# Patient Record
Sex: Male | Born: 1985 | Race: Black or African American | Hispanic: No | Marital: Single | Smoking: Former smoker
Health system: Southern US, Community
[De-identification: ages and names within clinical notes are randomized; demographics above are authoritative.]

## PROBLEM LIST (undated history)

## (undated) DIAGNOSIS — Z789 Other specified health status: Secondary | ICD-10-CM

## (undated) HISTORY — PX: HAND SURGERY: SHX662

---

## 2003-04-14 ENCOUNTER — Emergency Department (HOSPITAL_COMMUNITY): Admission: EM | Admit: 2003-04-14 | Discharge: 2003-04-15 | Payer: Self-pay | Admitting: *Deleted

## 2003-04-24 ENCOUNTER — Emergency Department (HOSPITAL_COMMUNITY): Admission: EM | Admit: 2003-04-24 | Discharge: 2003-04-24 | Payer: Self-pay | Admitting: Emergency Medicine

## 2003-04-30 ENCOUNTER — Ambulatory Visit (HOSPITAL_COMMUNITY): Admission: RE | Admit: 2003-04-30 | Discharge: 2003-04-30 | Payer: Self-pay | Admitting: Orthopedic Surgery

## 2003-05-14 ENCOUNTER — Ambulatory Visit (HOSPITAL_COMMUNITY): Admission: RE | Admit: 2003-05-14 | Discharge: 2003-05-14 | Payer: Self-pay | Admitting: *Deleted

## 2003-05-29 ENCOUNTER — Ambulatory Visit (HOSPITAL_COMMUNITY): Admission: RE | Admit: 2003-05-29 | Discharge: 2003-05-29 | Payer: Self-pay | Admitting: Orthopedic Surgery

## 2003-06-19 ENCOUNTER — Encounter (HOSPITAL_COMMUNITY): Admission: RE | Admit: 2003-06-19 | Discharge: 2003-07-19 | Payer: Self-pay | Admitting: Orthopedic Surgery

## 2005-06-30 ENCOUNTER — Emergency Department (HOSPITAL_COMMUNITY): Admission: EM | Admit: 2005-06-30 | Discharge: 2005-06-30 | Payer: Self-pay | Admitting: Emergency Medicine

## 2005-10-16 ENCOUNTER — Emergency Department (HOSPITAL_COMMUNITY): Admission: EM | Admit: 2005-10-16 | Discharge: 2005-10-16 | Payer: Self-pay | Admitting: Emergency Medicine

## 2005-10-27 ENCOUNTER — Ambulatory Visit: Payer: Self-pay | Admitting: Orthopedic Surgery

## 2011-08-27 ENCOUNTER — Emergency Department (HOSPITAL_COMMUNITY): Payer: Self-pay

## 2011-08-27 ENCOUNTER — Inpatient Hospital Stay (HOSPITAL_COMMUNITY)
Admission: EM | Admit: 2011-08-27 | Discharge: 2011-08-29 | DRG: 641 | Disposition: A | Discharge status: Home / Self Care | Payer: Self-pay | Attending: Internal Medicine | Admitting: Internal Medicine

## 2011-08-27 ENCOUNTER — Encounter (HOSPITAL_COMMUNITY): Payer: Self-pay | Admitting: Emergency Medicine

## 2011-08-27 DIAGNOSIS — T678XXA Other effects of heat and light, initial encounter: Secondary | ICD-10-CM | POA: Diagnosis present

## 2011-08-27 DIAGNOSIS — Z6830 Body mass index (BMI) 30.0-30.9, adult: Secondary | ICD-10-CM

## 2011-08-27 DIAGNOSIS — R739 Hyperglycemia, unspecified: Secondary | ICD-10-CM | POA: Diagnosis present

## 2011-08-27 DIAGNOSIS — D72829 Elevated white blood cell count, unspecified: Secondary | ICD-10-CM | POA: Diagnosis present

## 2011-08-27 DIAGNOSIS — F4489 Other dissociative and conversion disorders: Secondary | ICD-10-CM

## 2011-08-27 DIAGNOSIS — R7989 Other specified abnormal findings of blood chemistry: Secondary | ICD-10-CM

## 2011-08-27 DIAGNOSIS — R4182 Altered mental status, unspecified: Secondary | ICD-10-CM

## 2011-08-27 DIAGNOSIS — R410 Disorientation, unspecified: Secondary | ICD-10-CM | POA: Diagnosis present

## 2011-08-27 DIAGNOSIS — F05 Delirium due to known physiological condition: Secondary | ICD-10-CM | POA: Diagnosis present

## 2011-08-27 DIAGNOSIS — E860 Dehydration: Principal | ICD-10-CM | POA: Diagnosis present

## 2011-08-27 DIAGNOSIS — R7309 Other abnormal glucose: Secondary | ICD-10-CM | POA: Diagnosis present

## 2011-08-27 DIAGNOSIS — E669 Obesity, unspecified: Secondary | ICD-10-CM | POA: Diagnosis present

## 2011-08-27 DIAGNOSIS — Y93H9 Activity, other involving exterior property and land maintenance, building and construction: Secondary | ICD-10-CM

## 2011-08-27 DIAGNOSIS — X30XXXA Exposure to excessive natural heat, initial encounter: Secondary | ICD-10-CM | POA: Diagnosis present

## 2011-08-27 HISTORY — DX: Other specified health status: Z78.9

## 2011-08-27 LAB — CK TOTAL AND CKMB (NOT AT ARMC)
CK, MB: 0.8 ng/mL (ref 0.3–4.0)
Relative Index: 0.3 (ref 0.0–2.5)
Total CK: 241 U/L — ABNORMAL HIGH (ref 7–232)

## 2011-08-27 LAB — MRSA PCR SCREENING: MRSA by PCR: NEGATIVE

## 2011-08-27 LAB — DIFFERENTIAL
Eosinophils Absolute: 0 10*3/uL (ref 0.0–0.7)
Eosinophils Relative: 0 % (ref 0–5)
Lymphocytes Relative: 12 % (ref 12–46)
Lymphs Abs: 1.9 10*3/uL (ref 0.7–4.0)
Monocytes Absolute: 0.7 10*3/uL (ref 0.1–1.0)

## 2011-08-27 LAB — COMPREHENSIVE METABOLIC PANEL
BUN: 12 mg/dL (ref 6–23)
CO2: 18 mEq/L — ABNORMAL LOW (ref 19–32)
Calcium: 11.3 mg/dL — ABNORMAL HIGH (ref 8.4–10.5)
Creatinine, Ser: 1.18 mg/dL (ref 0.50–1.35)
GFR calc Af Amer: >90 mL/min (ref >90)
GFR calc non Af Amer: 83 mL/min — ABNORMAL LOW (ref >90)
Glucose, Bld: 149 mg/dL — ABNORMAL HIGH (ref 70–99)
Total Protein: 8.5 g/dL — ABNORMAL HIGH (ref 6.0–8.3)

## 2011-08-27 LAB — POCT I-STAT, CHEM 8
BUN: 11 mg/dL (ref 6–23)
Calcium, Ion: 1.24 mmol/L (ref 1.12–1.32)
Chloride: 108 mEq/L (ref 96–112)
Creatinine, Ser: 1.3 mg/dL (ref 0.50–1.35)
Glucose, Bld: 140 mg/dL — ABNORMAL HIGH (ref 70–99)

## 2011-08-27 LAB — URINALYSIS, ROUTINE W REFLEX MICROSCOPIC
Glucose, UA: NEGATIVE mg/dL
Ketones, ur: 40 mg/dL — AB

## 2011-08-27 LAB — PROTIME-INR: Prothrombin Time: 13.7 seconds (ref 11.6–15.2)

## 2011-08-27 LAB — URINE MICROSCOPIC-ADD ON

## 2011-08-27 LAB — CBC
HCT: 43.6 % (ref 39.0–52.0)
MCH: 27.5 pg (ref 26.0–34.0)
MCV: 82.1 fL (ref 78.0–100.0)
Platelets: 192 10*3/uL (ref 150–400)
RBC: 5.31 MIL/uL (ref 4.22–5.81)
WBC: 15.5 10*3/uL — ABNORMAL HIGH (ref 4.0–10.5)

## 2011-08-27 LAB — RAPID URINE DRUG SCREEN, HOSP PERFORMED
Barbiturates: NOT DETECTED
Benzodiazepines: NOT DETECTED

## 2011-08-27 LAB — GLUCOSE, CAPILLARY: Glucose-Capillary: 99 mg/dL (ref 70–99)

## 2011-08-27 LAB — TROPONIN I: Troponin I: <0.3 ng/mL (ref <0.30)

## 2011-08-27 MED ORDER — LORAZEPAM 2 MG/ML IJ SOLN
1.0000 mg | Freq: Once | INTRAMUSCULAR | Status: AC
  Administered 2011-08-27: 1 mg via INTRAVENOUS

## 2011-08-27 MED ORDER — SODIUM CHLORIDE 0.9 % IV SOLN
INTRAVENOUS | Status: AC
  Administered 2011-08-27: 21:00:00 via INTRAVENOUS

## 2011-08-27 MED ORDER — SODIUM CHLORIDE 0.9 % IJ SOLN
3.0000 mL | Freq: Two times a day (BID) | INTRAMUSCULAR | Status: DC
  Administered 2011-08-28: 3 mL via INTRAVENOUS

## 2011-08-27 MED ORDER — ZIPRASIDONE MESYLATE 20 MG IM SOLR
INTRAMUSCULAR | Status: AC
  Filled 2011-08-27: qty 20

## 2011-08-27 MED ORDER — SODIUM CHLORIDE 0.9 % IV BOLUS (SEPSIS)
2000.0000 mL | Freq: Once | INTRAVENOUS | Status: AC
  Administered 2011-08-27: 2000 mL via INTRAVENOUS

## 2011-08-27 MED ORDER — THIAMINE HCL 100 MG/ML IJ SOLN
Freq: Once | INTRAVENOUS | Status: AC
  Administered 2011-08-27: 23:00:00 via INTRAVENOUS
  Filled 2011-08-27: qty 1000

## 2011-08-27 MED ORDER — POTASSIUM CHLORIDE IN NACL 20-0.9 MEQ/L-% IV SOLN
INTRAVENOUS | Status: DC
  Administered 2011-08-27 – 2011-08-28 (×3): via INTRAVENOUS

## 2011-08-27 MED ORDER — ENOXAPARIN SODIUM 40 MG/0.4ML ~~LOC~~ SOLN
40.0000 mg | SUBCUTANEOUS | Status: DC
  Administered 2011-08-27 – 2011-08-28 (×2): 40 mg via SUBCUTANEOUS
  Filled 2011-08-27: qty 0.4

## 2011-08-27 MED ORDER — LORAZEPAM 2 MG/ML IJ SOLN
INTRAMUSCULAR | Status: AC
  Filled 2011-08-27: qty 2

## 2011-08-27 MED ORDER — LORAZEPAM 2 MG/ML IJ SOLN: 4.0000 mg | Freq: Once | INTRAMUSCULAR | Status: DC

## 2011-08-27 MED ORDER — ZIPRASIDONE MESYLATE 20 MG IM SOLR
20.0000 mg | Freq: Once | INTRAMUSCULAR | Status: AC
Start: 2011-08-27 — End: 2011-08-27

## 2011-08-27 MED ORDER — LORAZEPAM 2 MG/ML IJ SOLN: 4.0000 mg | Freq: Once | INTRAMUSCULAR | Status: AC

## 2011-08-27 MED ORDER — LORAZEPAM 2 MG/ML IJ SOLN
INTRAMUSCULAR | Status: AC
  Filled 2011-08-27: qty 1

## 2011-08-27 MED ORDER — ACETAMINOPHEN 650 MG RE SUPP
650.0000 mg | RECTAL | Status: DC | PRN
  Administered 2011-08-27: 650 mg via RECTAL
  Filled 2011-08-27: qty 1

## 2011-08-27 MED ORDER — ZIPRASIDONE MESYLATE 20 MG IM SOLR
10.0000 mg | Freq: Once | INTRAMUSCULAR | Status: AC
  Administered 2011-08-27: 10 mg via INTRAMUSCULAR

## 2011-08-27 MED ORDER — M.V.I. ADULT IV INJ
INJECTION | INTRAVENOUS | Status: AC
  Filled 2011-08-27: qty 10

## 2011-08-27 MED ORDER — THIAMINE HCL 100 MG/ML IJ SOLN
INTRAMUSCULAR | Status: AC
  Filled 2011-08-27: qty 2

## 2011-08-27 MED ORDER — DROPERIDOL 2.5 MG/ML IJ SOLN
2.5000 mg | Freq: Once | INTRAMUSCULAR | Status: AC
  Administered 2011-08-27: 2.5 mg via INTRAVENOUS
  Filled 2011-08-27: qty 2

## 2011-08-27 MED ORDER — FOLIC ACID 5 MG/ML IJ SOLN
INTRAMUSCULAR | Status: AC
  Filled 2011-08-27: qty 0.2

## 2011-08-27 MED ORDER — ACETAMINOPHEN 325 MG PO TABS: 650.0000 mg | ORAL_TABLET | ORAL | Status: DC | PRN

## 2011-08-27 MED ORDER — SODIUM CHLORIDE 0.9 % IV BOLUS (SEPSIS)
1000.0000 mL | Freq: Once | INTRAVENOUS | Status: AC
  Administered 2011-08-27: 1000 mL via INTRAVENOUS

## 2011-08-27 NOTE — ED Notes (Signed)
Patient restraints removed. Patient not combative at this time, responding more when asked questions.

## 2011-08-27 NOTE — ED Notes (Signed)
ems called by girlfriend. Girlfriend states patient was out mowing the yard and buckled down like he had been stung by something. No obvious bite marks or swelling noted. Patient yelling no, not responding when asked questions.

## 2011-08-27 NOTE — ED Notes (Signed)
Patient responding well to restraints, no acute distress noted. Patient still combative when you attempt to wake him up or perform any interventions. Attempted to reorient patient.

## 2011-08-27 NOTE — ED Provider Notes (Addendum)
History     CSN: [id number]  Arrival date & time 08/27/11  1858   First MD Initiated Contact with Patient 08/27/11 1900      Chief Complaint  Patient presents with  . Altered Mental Status   Level 5- patient with altered mental status and uncooperative (Consider location/radiation/quality/duration/timing/severity/associated sxs/prior treatment) HPI Per EMS report patient was mowing lawn when he fell to ground and began yelling.  He has continued agitated and is not making appropriate responses. EMS reports they found no evidence of insect sting or bite and patient without reported psychiatric, substance abuse, or medical problems.  BS 163 prehospital and patient vomited twice with a greenish color.  History reviewed. No pertinent past medical history.  History reviewed. No pertinent past surgical history.  History reviewed. No pertinent family history.  History  Substance Use Topics  . Smoking status: Unknown If Ever Smoked  . Smokeless tobacco: Not on file  . Alcohol Use: No      Review of Systems  Unable to perform ROS   Allergies  Review of patient's allergies indicates no known allergies.  Home Medications  No current outpatient prescriptions on file.  BP 121/69  Pulse 93  Resp 24  Ht 5\' 7"  (1.702 m)  Wt 200 lb (90.719 kg)  BMI 31.32 kg/m2  SpO2 100%  Physical Exam  Nursing note and vitals reviewed. Constitutional: He appears well-developed and well-nourished.       Exam limited due to patient's agitation  HENT:  Head: Normocephalic and atraumatic.  Cardiovascular: Normal rate and regular rhythm.   Pulmonary/Chest: Effort normal and breath sounds normal.  Abdominal: Soft.  Musculoskeletal: Normal range of motion.  Neurological:       Patient combative- not oriented to place.  Unable to answer questions- moves all four extremities equally.     ED Course  Procedures (including critical care time) Patient given geodon 20 im, then ativan 1 iv and  droperidol 2.5 iv.  Patient continued combative and received additional ativan 4 mg iv.  Additional physical exam done -  Vital signs remain stable. Head- no signs of trauma Pupils- 2 mm reactive Neck- supple Lungs- cta Skin- no lesions seen.  Labs Reviewed - No data to display No results found.  Results for orders placed during the hospital encounter of 08/27/11  [facility]      Component Value Range   Prothrombin Time 13.7  11.6 - 15.2 seconds   INR 1.03  0.00 - 1.49  APTT      Component Value Range   aPTT 24  24 - 37 seconds  CBC      Component Value Range   WBC 15.5 (*) 4.0 - 10.5 K/uL   RBC 5.31  4.22 - 5.81 MIL/uL   Hemoglobin 14.6  13.0 - 17.0 g/dL   HCT 14.7  82.9 - 56.2 %   MCV 82.1  78.0 - 100.0 fL   MCH 27.5  26.0 - 34.0 pg   MCHC 33.5  30.0 - 36.0 g/dL   RDW 13.0  86.5 - 78.4 %   Platelets 192  150 - 400 K/uL  DIFFERENTIAL      Component Value Range   Neutrophils Relative 83 (*) 43 - 77 %   Neutro Abs 12.9 (*) 1.7 - 7.7 K/uL   Lymphocytes Relative 12  12 - 46 %   Lymphs Abs 1.9  0.7 - 4.0 K/uL   Monocytes Relative 4  3 - 12 %   Monocytes Absolute  0.7  0.1 - 1.0 K/uL   Eosinophils Relative 0  0 - 5 %   Eosinophils Absolute 0.0  0.0 - 0.7 K/uL   Basophils Relative 0  0 - 1 %   Basophils Absolute 0.0  0.0 - 0.1 K/uL  COMPREHENSIVE METABOLIC PANEL      Component Value Range   Sodium 139  135 - 145 mEq/L   Potassium 3.6  3.5 - 5.1 mEq/L   Chloride 101  96 - 112 mEq/L   CO2 18 (*) 19 - 32 mEq/L   Glucose, Bld 149 (*) 70 - 99 mg/dL   BUN 12  6 - 23 mg/dL   Creatinine, Ser 1.61  0.50 - 1.35 mg/dL   Calcium 09.6 (*) 8.4 - 10.5 mg/dL   Total Protein 8.5 (*) 6.0 - 8.3 g/dL   Albumin 4.9  3.5 - 5.2 g/dL   AST 21  0 - 37 U/L   ALT 27  0 - 53 U/L   Alkaline Phosphatase 131 (*) 39 - 117 U/L   Total Bilirubin 0.8  0.3 - 1.2 mg/dL   GFR calc non Af Amer 83 (*) >90 mL/min   GFR calc Af Amer >90  >90 mL/min  CK TOTAL AND CKMB      Component Value Range   Total  CK 241 (*) 7 - 232 U/L   CK, MB 0.8  0.3 - 4.0 ng/mL   Relative Index 0.3  0.0 - 2.5  TROPONIN I      Component Value Range   Troponin I <0.30  <0.30 ng/mL  URINE RAPID DRUG SCREEN (HOSP PERFORMED)      Component Value Range   Opiates NONE DETECTED  NONE DETECTED   Cocaine NONE DETECTED  NONE DETECTED   Benzodiazepines NONE DETECTED  NONE DETECTED   Amphetamines NONE DETECTED  NONE DETECTED   Tetrahydrocannabinol POSITIVE (*) NONE DETECTED   Barbiturates NONE DETECTED  NONE DETECTED  ETHANOL      Component Value Range   Alcohol, Ethyl (B) <11  0 - 11 mg/dL  POCT I-STAT, CHEM 8      Component Value Range   Sodium 144  135 - 145 mEq/L   Potassium 3.6  3.5 - 5.1 mEq/L   Chloride 108  96 - 112 mEq/L   BUN 11  6 - 23 mg/dL   Creatinine, Ser 0.45  0.50 - 1.35 mg/dL   Glucose, Bld 409 (*) 70 - 99 mg/dL   Calcium, Ion 8.11  9.14 - 1.32 mmol/L   TCO2 18  0 - 100 mmol/L   Hemoglobin 15.6  13.0 - 17.0 g/dL   HCT 78.2  95.6 - 21.3 %  GLUCOSE, CAPILLARY      Component Value Range   Glucose-Capillary 99  70 - 99 mg/dL   Date: 08/65/7846  Rate: 96  Rhythm: normal sinus rhythm  QRS Axis: normal  Intervals: normal  ST/T Wave abnormalities: normal  Conduction Disutrbances: none  Narrative Interpretation: unremarkable      No diagnosis found.  8:03 PM Patient's gf arrived and states they were mowing grass when he thought a bee stung him.  He went to sit down then began complaining of a headache.  Thirty minutes later they were driving home and he continued to complain of severe headache then began getting more confused.  She called ems.  She states he does not use drugs or drink alcohol and has no known medical problems.  CRITICAL CARE Performed by: Margarita Grizzle  S   Total critical care time: 60  Critical care time was exclusive of separately billable procedures and treating other patients.  Critical care was necessary to treat or prevent imminent or life-threatening  deterioration.  Critical care was time spent personally by me on the following activities: development of treatment plan with patient and/or surrogate as well as nursing, discussions with consultants, evaluation of patient's response to treatment, examination of patient, obtaining history from patient or surrogate, ordering and performing treatments and interventions, ordering and review of laboratory studies, ordering and review of radiographic studies, pulse oximetry and re-evaluation of patient's condition.  Date: 10/02/2011  Rate: 99  Rhythm: normal sinus rhythm  QRS Axis: normal  Intervals: normal  ST/T Wave abnormalities: normal  Conduction Disutrbances: none  Narrative Interpretation: unremarkable     MDM  Patient with confusion of unclear etiology.  Head CT without acute abnormality. His urine is positive for THC.  Patient's care discussed with Dr. Orvan Falconer. He will be admitted to a step down bed.      Hilario Quarry, MD 08/27/11 1610  Hilario Quarry, MD 09/28/11 9604  Hilario Quarry, MD 10/02/11 5409

## 2011-08-27 NOTE — ED Notes (Addendum)
Patient placed in orange and black gurney four point restraints as directed by Dr. Rosalia Hammers. Patient combative, swinging, and cursing at staff. Patient attempted to grab and hit several staff members, patient confused and unaware of surroundings or situation. Staff attempted to reorient patient prior to restraining patient. Medication not effective. Dr. Rosalia Hammers gave verbal order to place patient in restraints because it was essential for [place] to obtain a stat ct and also to get a urine specimen.

## 2011-08-27 NOTE — H&P (Addendum)
PCP:   No primary provider on file.   Chief Complaint:  Acute confusion since this afternoon   History is taken from patient's girlfriend, due to patient's acute confusional state.  HPI: Walter Blackwell is an 26 y.o. male.   Patient was mowing the lawn between 12 noon and 3 PM today. At one point patient screamed out he had been bitten on his left leg, he felt it may have been by a bee. Patient's leg was inspected and reportedly showed a swollen area compatible with a bite and he subsequently began so act strangely. Although he continued mowing the lawn he complained of a headache, and began to vomit,  and his girlfriend eventually persuaded him to sit down and rest, but he soon began to appear very confused, seemed to be clearly recognizing his girlfriend or his baby, complaining of severe headache calling out and in general acting strange. As he showed no signs of recovery girlfriend eventually called ambulance and he was brought to the emergency room at around 7 PM.   In emergency room patient was confused and combative, required 4-point restraints and several doses of Ativan and Geodon for sedation. Initial examination in the emergency room did not reveal any evidence of bites or swelling of the legs.  His girlfriend says he has no history of alcohol or illicit drug use, and because she was with him today as she doubts that there was an opportunity for acute stimulant drug ingestion.  He has no history of chronic medical problems and does not take any over-the-counter medication.  Rewiew of Systems:  The patient denies anorexia,  weight loss,, vision loss, decreased hearing, hoarseness, chest pain, syncope, dyspnea on exertion, peripheral edema, balance deficits, hemoptysis, abdominal pain, melena, hematochezia, severe indigestion/heartburn, hematuria, incontinence, genital sores, muscle weakness, suspicious skin lesions, transient blindness, difficulty walking, depression, unusual weight  change, abnormal bleeding, enlarged lymph nodes, angioedema, and breast masses.   Past Medical History  Diagnosis Date  . No pertinent past medical history     History reviewed. No pertinent past surgical history.  Medications:  HOME MEDS: Prior to Admission medications   Not on File     Allergies:  No Known Allergies  Social History:   has an unknown smoking status. He does not have any smokeless tobacco history on file. He reports that he does not drink alcohol. His drug history not on file.  Family History: History reviewed. No pertinent family history.   Physical Exam: Filed Vitals:   08/27/11 2015 08/27/11 2112 08/27/11 2153 08/27/11 2156  BP:  138/81  136/77  Pulse: 104 110  109  Temp:   101.4 F (38.6 C) 101.4 F (38.6 C)  TempSrc:   Oral Oral  Resp: 21 24  24   Height:   6\' 1"  (1.854 m) 6\' 1"  (1.854 m)  Weight:   104.3 kg (229 lb 15 oz) 104.3 kg (229 lb 15 oz)  SpO2: 100% 97%  100%   Blood pressure 136/77, pulse 109, temperature 101.4 F (38.6 C), temperature source Oral, resp. rate 24, height 6\' 1"  (1.854 m), weight 104.3 kg (229 lb 15 oz), SpO2 100.00%.  GEN:  obese but muscular young African American gentleman lying in the stretcher, sedated but rousable,  PSYCH:   unable to assess because of sedation  HEENT: Mucous membranes pink, dry  and anicteric; PERRLA; EOM intact; no cervical lymphadenopathy nor thyromegaly or carotid bruit;  Breasts:: Not examined CHEST WALL: No tenderness CHEST: Normal respiration, clear to  auscultation bilaterally HEART: Regular rhythm; tachycardia; no murmurs rubs or gallops BACK: No kyphosis or scoliosis; no CVA tenderness ABDOMEN: Obese, soft non-tender; no masses, no organomegaly, normal abdominal bowel sounds; no pannus; no intertriginous candida. Rectal Exam: Not done EXTREMITIES: No bone or joint deformity; no edema; no ulcerations. no swelling; no bruises  Genitalia: not examined PULSES: 2+ and symmetric SKIN:  Normal hydration no rash or ulceration CNS: Cranial nerves 2-12 grossly intact no focal lateralizing neurologic deficit   Labs & Imaging Results for orders placed during the hospital encounter of 08/27/11 (from the past 48 hour(s))  PROTIME-INR     Status: Normal   Collection Time   08/27/11  7:17 PM      Component Value Range Comment   Prothrombin Time 13.7  11.6 - 15.2 seconds    INR 1.03  0.00 - 1.49   APTT     Status: Normal   Collection Time   08/27/11  7:17 PM      Component Value Range Comment   aPTT 24  24 - 37 seconds   CBC     Status: Abnormal   Collection Time   08/27/11  7:17 PM      Component Value Range Comment   WBC 15.5 (*) 4.0 - 10.5 K/uL    RBC 5.31  4.22 - 5.81 MIL/uL    Hemoglobin 14.6  13.0 - 17.0 g/dL    HCT 16.1  09.6 - 04.5 %    MCV 82.1  78.0 - 100.0 fL    MCH 27.5  26.0 - 34.0 pg    MCHC 33.5  30.0 - 36.0 g/dL    RDW 40.9  81.1 - 91.4 %    Platelets 192  150 - 400 K/uL   DIFFERENTIAL     Status: Abnormal   Collection Time   08/27/11  7:17 PM      Component Value Range Comment   Neutrophils Relative 83 (*) 43 - 77 %    Neutro Abs 12.9 (*) 1.7 - 7.7 K/uL    Lymphocytes Relative 12  12 - 46 %    Lymphs Abs 1.9  0.7 - 4.0 K/uL    Monocytes Relative 4  3 - 12 %    Monocytes Absolute 0.7  0.1 - 1.0 K/uL    Eosinophils Relative 0  0 - 5 %    Eosinophils Absolute 0.0  0.0 - 0.7 K/uL    Basophils Relative 0  0 - 1 %    Basophils Absolute 0.0  0.0 - 0.1 K/uL   COMPREHENSIVE METABOLIC PANEL     Status: Abnormal   Collection Time   08/27/11  7:17 PM      Component Value Range Comment   Sodium 139  135 - 145 mEq/L    Potassium 3.6  3.5 - 5.1 mEq/L    Chloride 101  96 - 112 mEq/L    CO2 18 (*) 19 - 32 mEq/L    Glucose, Bld 149 (*) 70 - 99 mg/dL    BUN 12  6 - 23 mg/dL    Creatinine, Ser 7.82  0.50 - 1.35 mg/dL    Calcium 95.6 (*) 8.4 - 10.5 mg/dL    Total Protein 8.5 (*) 6.0 - 8.3 g/dL    Albumin 4.9  3.5 - 5.2 g/dL    AST 21  0 - 37 U/L    ALT 27  0 -  53 U/L    Alkaline Phosphatase 131 (*) 39 -  117 U/L    Total Bilirubin 0.8  0.3 - 1.2 mg/dL    GFR calc non Af Amer 83 (*) >90 mL/min    GFR calc Af Amer >90  >90 mL/min   CK TOTAL AND CKMB     Status: Abnormal   Collection Time   08/27/11  7:17 PM      Component Value Range Comment   Total CK 241 (*) 7 - 232 U/L    CK, MB 0.8  0.3 - 4.0 ng/mL    Relative Index 0.3  0.0 - 2.5   TROPONIN I     Status: Normal   Collection Time   08/27/11  7:17 PM      Component Value Range Comment   Troponin I <0.30  <0.30 ng/mL   ETHANOL     Status: Normal   Collection Time   08/27/11  7:17 PM      Component Value Range Comment   Alcohol, Ethyl (B) <11  0 - 11 mg/dL   URINE RAPID DRUG SCREEN (HOSP PERFORMED)     Status: Abnormal   Collection Time   08/27/11  7:56 PM      Component Value Range Comment   Opiates NONE DETECTED  NONE DETECTED    Cocaine NONE DETECTED  NONE DETECTED    Benzodiazepines NONE DETECTED  NONE DETECTED    Amphetamines NONE DETECTED  NONE DETECTED    Tetrahydrocannabinol POSITIVE (*) NONE DETECTED    Barbiturates NONE DETECTED  NONE DETECTED   URINALYSIS, ROUTINE W REFLEX MICROSCOPIC     Status: Abnormal   Collection Time   08/27/11  7:56 PM      Component Value Range Comment   Color, Urine YELLOW  YELLOW    APPearance CLEAR  CLEAR    Specific Gravity, Urine 1.025  1.005 - 1.030    pH 6.0  5.0 - 8.0    Glucose, UA NEGATIVE  NEGATIVE mg/dL    Hgb urine dipstick SMALL (*) NEGATIVE    Bilirubin Urine NEGATIVE  NEGATIVE    Ketones, ur 40 (*) NEGATIVE mg/dL    Protein, ur TRACE (*) NEGATIVE mg/dL    Urobilinogen, UA 0.2  0.0 - 1.0 mg/dL    Nitrite NEGATIVE  NEGATIVE    Leukocytes, UA NEGATIVE  NEGATIVE   URINE MICROSCOPIC-ADD ON     Status: Normal   Collection Time   08/27/11  7:56 PM      Component Value Range Comment   Squamous Epithelial / LPF RARE  RARE    WBC, UA 0-2  <3 WBC/hpf    RBC / HPF 3-6  <3 RBC/hpf    Bacteria, UA RARE  RARE    Urine-Other MUCOUS PRESENT      POCT I-STAT, CHEM 8     Status: Abnormal   Collection Time   08/27/11  7:59 PM      Component Value Range Comment   Sodium 144  135 - 145 mEq/L    Potassium 3.6  3.5 - 5.1 mEq/L    Chloride 108  96 - 112 mEq/L    BUN 11  6 - 23 mg/dL    Creatinine, Ser 1.61  0.50 - 1.35 mg/dL    Glucose, Bld 096 (*) 70 - 99 mg/dL    Calcium, Ion 0.45  4.09 - 1.32 mmol/L    TCO2 18  0 - 100 mmol/L    Hemoglobin 15.6  13.0 - 17.0 g/dL    HCT 81.1  91.4 -  52.0 %   GLUCOSE, CAPILLARY     Status: Normal   Collection Time   08/27/11  8:02 PM      Component Value Range Comment   Glucose-Capillary 99  70 - 99 mg/dL    Dg Chest 1 View  7/82/9562  *RADIOLOGY REPORT*  Clinical Data: Acute psychosis  CHEST - 1 VIEW  Comparison: Portable exam 2045 hours without priors for comparison  Findings: Low inspiratory volumes. Normal heart size, mediastinal contours and pulmonary vascularity for technique. Lungs grossly clear. No pleural effusion or pneumothorax.  IMPRESSION: Low lung volumes. Otherwise negative exam.  Original Report Authenticated By: Lollie Marrow, M.D.   Ct Head Wo Contrast  08/27/2011  *RADIOLOGY REPORT*  Clinical Data: Altered mental status.  CT HEAD WITHOUT CONTRAST  Technique:  Contiguous axial images were obtained from the base of the skull through the vertex without contrast.  Comparison: No priors.  Findings: No acute intracranial abnormalities.  Specifically, no signs of acute/subacute cerebral ischemia, no evidence of acute intracranial hemorrhage, no focal mass, mass effect, hydrocephalus or abnormal intra or extra-axial fluid collections.  No acute displaced skull fractures are identified.  Visualized paranasal sinuses and mastoids are well pneumatized.  IMPRESSION: 1.  No acute intracranial abnormalities.  The appearance of brain is normal.  Original Report Authenticated By: Florencia Reasons, M.D.      Assessment Present on Admission:  .Acute or subacute confusional psychotic  state .Acute delirium .Hypercalcemia, probably due to dehydration  .Leukocytosis, only due to acute dehydration and reactive state  .Hyperglycemia .Obesity   Discussion/PLAN: Headache nausea vomiting tachycardia fever and acute confusional state are compatible with both acute stimulant ingestion, or other poisoning including insect bite. Possibly early CNS infection. Possibly acute psychotic reaction related to recent marijuana use.  We'll hydrate this gentleman vigorously, recheck labs including CPK and cardiac enzymes in the morning. Will review him in a few hours, blood cultures and depending on his mental status start empiric doxycycline.   [place] demographic will also check acute HIV status.  Depending on his progress, consider lumbar puncture.  Other plans as per orders.  Sendy Pluta 08/27/2011, 9:59 PM

## 2011-08-27 NOTE — ED Notes (Signed)
Attempted to reorient patient. No acute distress noted at this time. Patient still unable to follow commands.

## 2011-08-28 DIAGNOSIS — F4489 Other dissociative and conversion disorders: Secondary | ICD-10-CM

## 2011-08-28 DIAGNOSIS — F05 Delirium due to known physiological condition: Secondary | ICD-10-CM

## 2011-08-28 DIAGNOSIS — R7989 Other specified abnormal findings of blood chemistry: Secondary | ICD-10-CM

## 2011-08-28 LAB — COMPREHENSIVE METABOLIC PANEL
ALT: 18 U/L (ref 0–53)
AST: 18 U/L (ref 0–37)
Albumin: 3.4 g/dL — ABNORMAL LOW (ref 3.5–5.2)
Alkaline Phosphatase: 99 U/L (ref 39–117)
Chloride: 110 mEq/L (ref 96–112)
Potassium: 3.6 mEq/L (ref 3.5–5.1)
Sodium: 140 mEq/L (ref 135–145)
Total Bilirubin: 0.7 mg/dL (ref 0.3–1.2)
Total Protein: 6.2 g/dL (ref 6.0–8.3)

## 2011-08-28 LAB — TSH: TSH: 0.957 u[IU]/mL (ref 0.350–4.500)

## 2011-08-28 LAB — CBC
HCT: 35.2 % — ABNORMAL LOW (ref 39.0–52.0)
Platelets: 175 10*3/uL (ref 150–400)
RDW: 13.8 % (ref 11.5–15.5)
WBC: 11.4 10*3/uL — ABNORMAL HIGH (ref 4.0–10.5)

## 2011-08-28 LAB — HEMOGLOBIN A1C
Hgb A1c MFr Bld: 5.6 % (ref <5.7)
Mean Plasma Glucose: 114 mg/dL (ref <117)

## 2011-08-28 LAB — CARDIAC PANEL(CRET KIN+CKTOT+MB+TROPI): Relative Index: 0.2 (ref 0.0–2.5)

## 2011-08-28 MED ORDER — DOXYCYCLINE HYCLATE 100 MG IV SOLR
100.0000 mg | Freq: Two times a day (BID) | INTRAVENOUS | Status: DC
  Administered 2011-08-28 – 2011-08-29 (×3): 100 mg via INTRAVENOUS
  Filled 2011-08-28 (×5): qty 100

## 2011-08-28 MED ORDER — DOXYCYCLINE HYCLATE 100 MG IV SOLR
INTRAVENOUS | Status: AC
  Filled 2011-08-28: qty 100

## 2011-08-28 NOTE — Progress Notes (Signed)
Subjective: Patient appears to be clinically improving.  He is awake, alert, able to carry on a conversation.  Has continued headache.  No neck pain or vomiting.  No other complaints.  He reports that he was moving his lawn in the sun for approximately 3 hours prior to the onset of his symptoms.  There is questionable history of insect bite  Objective: Vital signs in last 24 hours: Temp:  [99.2 F (37.3 C)-101.4 F (38.6 C)] 99.3 F (37.4 C) (06/29 0742) Pulse Rate:  [91-119] 96  (06/29 0600) Resp:  [16-27] 20  (06/29 0600) BP: (91-142)/(43-85) 114/68 mmHg (06/29 0600) SpO2:  [97 %-100 %] 99 % (06/29 0600) Weight:  [90.719 kg (200 lb)-104.3 kg (229 lb 15 oz)] 104.3 kg (229 lb 15 oz) (06/28 2156) Weight change:  Last BM Date:  (unknown)  Intake/Output from previous day: 06/28 0701 - 06/29 0700 In: 1560 [I.V.:1310; IV Piggyback:250] Out: 1250 [Urine:1250]     Physical Exam: General: Alert, awake, oriented x3, in no acute distress. HEENT: No bruits, no goiter. No meningismus Heart: Regular rate and rhythm, without murmurs, rubs, gallops. Lungs: Clear to auscultation bilaterally. Abdomen: Soft, nontender, nondistended, positive bowel sounds. Extremities: No clubbing cyanosis or edema with positive pedal pulses. Neuro: Grossly intact, nonfocal.    Lab Results: Basic Metabolic Panel:  Basename 08/28/11 0500 08/27/11 1959 08/27/11 1917  NA 140 144 --  K 3.6 3.6 --  CL 110 108 --  CO2 21 -- 18*  GLUCOSE 96 140* --  BUN 8 11 --  CREATININE 0.96 1.30 --  CALCIUM 9.0 -- 11.3*  MG -- -- --  PHOS -- -- --   Liver Function Tests:  Basename 08/28/11 0500 08/27/11 1917  AST 18 21  ALT 18 27  ALKPHOS 99 131*  BILITOT 0.7 0.8  PROT 6.2 8.5*  ALBUMIN 3.4* 4.9   No results found for this basename: LIPASE:2,AMYLASE:2 in the last 72 hours No results found for this basename: AMMONIA:2 in the last 72 hours CBC:  Basename 08/28/11 0500 08/27/11 1959 08/27/11 1917  WBC 11.4* --  15.5*  NEUTROABS -- -- 12.9*  HGB 11.7* 15.6 --  HCT 35.2* 46.0 --  MCV 82.8 -- 82.1  PLT 175 -- 192   Cardiac Enzymes:  Basename 08/28/11 0500 08/27/11 1917  CKTOTAL 442* 241*  CKMB 1.0 0.8  CKMBINDEX -- --  TROPONINI <0.30 <0.30   BNP: No results found for this basename: PROBNP:3 in the last 72 hours D-Dimer: No results found for this basename: DDIMER:2 in the last 72 hours CBG:  Basename 08/27/11 2002  GLUCAP 99   Hemoglobin A1C: No results found for this basename: HGBA1C in the last 72 hours Fasting Lipid Panel: No results found for this basename: CHOL,HDL,LDLCALC,TRIG,CHOLHDL,LDLDIRECT in the last 72 hours Thyroid Function Tests: No results found for this basename: TSH,T4TOTAL,FREET4,T3FREE,THYROIDAB in the last 72 hours Anemia Panel: No results found for this basename: VITAMINB12,FOLATE,FERRITIN,TIBC,IRON,RETICCTPCT in the last 72 hours Coagulation:  Basename 08/27/11 1917  LABPROT 13.7  INR 1.03   Urine Drug Screen: Drugs of Abuse     Component Value Date/Time   LABOPIA NONE DETECTED 08/27/2011 1956   COCAINSCRNUR NONE DETECTED 08/27/2011 1956   LABBENZ NONE DETECTED 08/27/2011 1956   AMPHETMU NONE DETECTED 08/27/2011 1956   THCU POSITIVE* 08/27/2011 1956   LABBARB NONE DETECTED 08/27/2011 1956    Alcohol Level:  Basename 08/27/11 1917  ETH <11   Urinalysis:  Basename 08/27/11 1956  COLORURINE YELLOW  LABSPEC 1.025  PHURINE 6.0  GLUCOSEU NEGATIVE  HGBUR SMALL*  BILIRUBINUR NEGATIVE  KETONESUR 40*  PROTEINUR TRACE*  UROBILINOGEN 0.2  NITRITE NEGATIVE  LEUKOCYTESUR NEGATIVE    Recent Results (from the past 240 hour(s))  MRSA PCR SCREENING     Status: Normal   Collection Time   08/27/11  9:41 PM      Component Value Range Status Comment   MRSA by PCR NEGATIVE  NEGATIVE Final   CULTURE, BLOOD (ROUTINE X 2)     Status: Normal (Preliminary result)   Collection Time   08/28/11  5:01 AM      Component Value Range Status Comment   Specimen  Description BLOOD LEFT ARM   Final    Special Requests     Final    Value: BOTTLES DRAWN AEROBIC AND ANAEROBIC 8CC EACH BOTTLE   Culture PENDING   Incomplete    Report Status PENDING   Incomplete   CULTURE, BLOOD (ROUTINE X 2)     Status: Normal (Preliminary result)   Collection Time   08/28/11  5:01 AM      Component Value Range Status Comment   Specimen Description BLOOD LEFT HAND   Final    Special Requests BOTTLES DRAWN AEROBIC ONLY 8CC BOTTLE   Final    Culture PENDING   Incomplete    Report Status PENDING   Incomplete     Studies/Results: Dg Chest 1 View  08/27/2011  *RADIOLOGY REPORT*  Clinical Data: Acute psychosis  CHEST - 1 VIEW  Comparison: Portable exam 2045 hours without priors for comparison  Findings: Low inspiratory volumes. Normal heart size, mediastinal contours and pulmonary vascularity for technique. Lungs grossly clear. No pleural effusion or pneumothorax.  IMPRESSION: Low lung volumes. Otherwise negative exam.  Original Report Authenticated By: Lollie Marrow, M.D.   Ct Head Wo Contrast  08/27/2011  *RADIOLOGY REPORT*  Clinical Data: Altered mental status.  CT HEAD WITHOUT CONTRAST  Technique:  Contiguous axial images were obtained from the base of the skull through the vertex without contrast.  Comparison: No priors.  Findings: No acute intracranial abnormalities.  Specifically, no signs of acute/subacute cerebral ischemia, no evidence of acute intracranial hemorrhage, no focal mass, mass effect, hydrocephalus or abnormal intra or extra-axial fluid collections.  No acute displaced skull fractures are identified.  Visualized paranasal sinuses and mastoids are well pneumatized.  IMPRESSION: 1.  No acute intracranial abnormalities.  The appearance of brain is normal.  Original Report Authenticated By: Florencia Reasons, M.D.    Medications: Scheduled Meds:   . sodium chloride   Intravenous STAT  . doxycycline (VIBRAMYCIN) IV  100 mg Intravenous Q12H  . droperidol  2.5  mg Intravenous Once  . enoxaparin  40 mg Subcutaneous Q24H  . LORazepam      . LORazepam      . LORazepam  1 mg Intravenous Once  . LORazepam  4 mg Intravenous Once  . LORazepam  4 mg Intravenous Once  . general admission iv infusion   Intravenous Once  . sodium chloride  1,000 mL Intravenous Once  . sodium chloride  2,000 mL Intravenous Once  . sodium chloride  3 mL Intravenous Q12H  . ziprasidone      . ziprasidone  10 mg Intramuscular Once  . ziprasidone  20 mg Intramuscular Once   Continuous Infusions:   . 0.9 % NaCl with KCl 20 mEq / L 150 mL/hr at 08/28/11 0600   PRN Meds:.acetaminophen, acetaminophen  Assessment/Plan:  Active Problems:  Acute or subacute confusional psychotic state  Acute delirium  Hypercalcemia  Leukocytosis  Hyperglycemia  Obesity  Plan:  1. Acute delirium, possibly related to exertional heat illness. Although infectious etiology is a possibility, a CNS infection at this point is less likely.  He was started on IV doxycycline last night.  Cultures have been sent and will be followed up.  His mental status appears to be back to baseline.  2. Dehydration, improving with IVF  3. Leukocytosis, improved  4. Fever, possibly related to heat illness vs. Infection. Continue to monitor for recurrence.  5. Hypercalcemia, related to dehydration, improved.  5. Dispo.  Transfer to med surg bed, will continue to monitor for recurrence of symptoms.   LOS: 1 day   Jemmie Rhinehart [facility] Pager: [phone] 08/28/2011, 8:49 AM

## 2011-08-29 ENCOUNTER — Encounter (HOSPITAL_COMMUNITY): Payer: Self-pay | Admitting: *Deleted

## 2011-08-29 DIAGNOSIS — E860 Dehydration: Secondary | ICD-10-CM

## 2011-08-29 DIAGNOSIS — T678XXA Other effects of heat and light, initial encounter: Secondary | ICD-10-CM

## 2011-08-29 DIAGNOSIS — F05 Delirium due to known physiological condition: Secondary | ICD-10-CM

## 2011-08-29 LAB — CBC
HCT: 39.7 % (ref 39.0–52.0)
Hemoglobin: 13.3 g/dL (ref 13.0–17.0)
MCH: 27.8 pg (ref 26.0–34.0)
MCHC: 33.5 g/dL (ref 30.0–36.0)
MCV: 82.9 fL (ref 78.0–100.0)
RBC: 4.79 MIL/uL (ref 4.22–5.81)

## 2011-08-29 LAB — BASIC METABOLIC PANEL
BUN: 6 mg/dL (ref 6–23)
CO2: 20 mEq/L (ref 19–32)
Calcium: 9.9 mg/dL (ref 8.4–10.5)
Creatinine, Ser: 0.89 mg/dL (ref 0.50–1.35)
GFR calc non Af Amer: >90 mL/min (ref >90)
Glucose, Bld: 99 mg/dL (ref 70–99)

## 2011-08-29 MED ORDER — DOXYCYCLINE HYCLATE 100 MG PO TABS
100.0000 mg | ORAL_TABLET | Freq: Two times a day (BID) | ORAL | Status: DC
  Administered 2011-08-29: 100 mg via ORAL
  Filled 2011-08-29: qty 1

## 2011-08-29 MED ORDER — SODIUM CHLORIDE 0.9 % IJ SOLN
INTRAMUSCULAR | Status: AC
  Filled 2011-08-29: qty 3

## 2011-08-29 NOTE — Discharge Summary (Signed)
Physician Discharge Summary  Patient ID: HARRIET BOLLEN MRN: [id number] DOB/AGE: 1986/01/30 26 y.o.  Admit date: 08/27/2011 Discharge date: 08/29/2011  Primary Care Physician:  No primary provider on file.   Discharge Diagnoses:   Principal Problem:  Exertional Heat Illness Active Problems:  Acute or subacute confusional psychotic state  Acute delirium  Hypercalcemia  Leukocytosis  Hyperglycemia  Obesity  Dehydration   Medication List    Notice       You have not been prescribed any medications.            Discharge Exam: Blood pressure 125/74, pulse 69, temperature 97.5 F (36.4 C), temperature source Oral, resp. rate 17, height 6\' 1"  (1.854 m), weight 103.2 kg (227 lb 8.2 oz), SpO2 98.00%. NAD CTA B S1, S2, RRR Soft, NT, BS+ No edema b/l  Disposition and Follow-up:  Follow up with a primary doctor as needed  Consults: none   Significant Diagnostic Studies:  Dg Chest 1 View  08/27/2011  *RADIOLOGY REPORT*  Clinical Data: Acute psychosis  CHEST - 1 VIEW  Comparison: Portable exam 2045 hours without priors for comparison  Findings: Low inspiratory volumes. Normal heart size, mediastinal contours and pulmonary vascularity for technique. Lungs grossly clear. No pleural effusion or pneumothorax.  IMPRESSION: Low lung volumes. Otherwise negative exam.  Original Report Authenticated By: Lollie Marrow, M.D.   Ct Head Wo Contrast  08/27/2011  *RADIOLOGY REPORT*  Clinical Data: Altered mental status.  CT HEAD WITHOUT CONTRAST  Technique:  Contiguous axial images were obtained from the base of the skull through the vertex without contrast.  Comparison: No priors.  Findings: No acute intracranial abnormalities.  Specifically, no signs of acute/subacute cerebral ischemia, no evidence of acute intracranial hemorrhage, no focal mass, mass effect, hydrocephalus or abnormal intra or extra-axial fluid collections.  No acute displaced skull fractures are identified.  Visualized  paranasal sinuses and mastoids are well pneumatized.  IMPRESSION: 1.  No acute intracranial abnormalities.  The appearance of brain is normal.  Original Report Authenticated By: Florencia Reasons, M.D.    Brief H and P: For complete details please refer to admission H and P, but in brief Walter Blackwell is an 26 y.o. male. Patient was mowing the lawn between 12 noon and 3 PM today. At one point patient screamed out he had been bitten on his left leg, he felt it may have been by a bee. Patient's leg was inspected and reportedly showed a swollen area compatible with a bite and he subsequently began so act strangely. Although he continued mowing the lawn he complained of a headache, and began to vomit, and his girlfriend eventually persuaded him to sit down and rest, but he soon began to appear very confused, seemed to be clearly recognizing his girlfriend or his baby, complaining of severe headache calling out and in general acting strange. As he showed no signs of recovery girlfriend eventually called ambulance and he was brought to the emergency room at around 7 PM.  In emergency room patient was confused and combative, required 4-point restraints and several doses of Ativan and Geodon for sedation. Initial examination in the emergency room did not reveal any evidence of bites or swelling of the legs.  His girlfriend says he has no history of alcohol or illicit drug use, and because she was with him today as she doubts that there was an opportunity for acute stimulant drug ingestion.  He has no history of chronic medical problems and does not  take any over-the-counter medication.   Hospital Course:   This time was admitted to the hospital with acute delirium. Prior to arrival to the emergency room, patient was mowing his lawn in the sun for approximately 3 hours. He began complaining of a headache, began to vomit, and then became very confused and was not recognizing his family. His behavior became  very strange. He was brought to the emergency room for evaluation where he was noted to be febrile. He was significantly dehydrated. He required Ativan and Geodon for sedation. He was admitted to the step down for further evaluation. There was questionable history of a insect bite, although on arrival to the hospital he did not have any evidence of this on his legs. Patient had infectious workup done which was found to be unrevealing. Chest x-ray did not show any signs of pneumonia, urinalysis did not show any signs of infection and blood cultures have shown no growth. He did not have any meningismus. He was given IV fluids and due to history of possible insect bite he was started on doxycycline. Within 12 hours of admission patient's fever had resolved and his mental status had returned to baseline. He was continued on IV fluids. His dehydration has since resolved. Today he is feeling back to normal. He does not have any headache. He is requesting a discharge home. It is unlikely that his acute presentation was secondary to an infectious etiology. It is more likely that he has suffered a form of exertional heat illness. He was advised to keep herself hydrated especially when working out in the sun.  Time spent on Discharge:  Signed: Zyrah Wiswell [facility] Pager: [phone] 08/29/2011, 12:43 PM

## 2011-08-29 NOTE — Discharge Instructions (Signed)
Heat Illness  Heat exhaustion happens when the body loses too much water through sweating. This can lead to heat stroke. Heat stroke is a medical emergency. People who work in hot environments, athletes, and older people are at greater risk for suffering from heat illness.  SYMPTOMS    Exhaustion.   Dizziness.   Fainting.   Muscle cramps.   Nausea.   Vomiting.   Chills and goose bumps.  PREVENTION    You must drink increased amounts of water or other clear liquids during hot weather to prevent heat illness.   This is especially true if you work or do vigorous exercise in the heat. Up to a gallon of sweat can be lost every hour extremely hot or humid conditions.   You will stay cooler by reducing your effort and by dousing yourself with water often.   Certain drugs increase the risk of heat illness because they reduce sweating. These include antidepressants and antihistamines.   Be more cautious during hot weather.   Drink several glasses of water before, during, and after vigorous activity.  SEEK MEDICAL CARE IF:   You have any heat-related problems.  Document Released: 03/25/2004 Document Revised: 02/04/2011 Document Reviewed: 11/16/2007  [employer] Patient Information 2012 [employer].

## 2011-08-29 NOTE — Progress Notes (Signed)
PHARMACIST - PHYSICIAN COMMUNICATION DR:   Kerry Hough CONCERNING: Antibiotic IV to Oral Route Change Policy  RECOMMENDATION: This patient is receiving Doxycycline by the intravenous route.  Based on criteria approved by the Pharmacy and Therapeutics Committee, the antibiotic(s) is/are being converted to the equivalent oral dose form(s).   DESCRIPTION: These criteria include:  Patient being treated for a respiratory tract infection, urinary tract infection, or cellulitis  The patient is not neutropenic and does not exhibit a GI malabsorption state  The patient is eating (either orally or via tube) and/or has been taking other orally administered medications for a least 24 hours  The patient is improving clinically and has a Tmax < 100.5  If you have questions about this conversion, please contact the Pharmacy Department  [x]   [phone] )  [facility] []   [phone] )  [facility]  []   [phone] )  [facility] []   [phone] )  Ilene Qua    Junita Push, PharmD, BCPS 08/29/2011@9 :44 AM

## 2011-09-01 LAB — HIV-1 RNA QUANT-NO REFLEX-BLD: HIV 1 RNA Quant: <20 copies/mL (ref <20)

## 2011-09-02 LAB — CULTURE, BLOOD (ROUTINE X 2): Culture: NO GROWTH

## 2011-09-29 NOTE — Progress Notes (Signed)
UR Chart Review Completed  

## 2016-12-31 ENCOUNTER — Emergency Department (HOSPITAL_COMMUNITY)
Admission: EM | Admit: 2016-12-31 | Discharge: 2016-12-31 | Disposition: A | Discharge status: Home / Self Care | Payer: Self-pay | Attending: Emergency Medicine | Admitting: Emergency Medicine

## 2016-12-31 ENCOUNTER — Encounter (HOSPITAL_COMMUNITY): Payer: Self-pay | Admitting: Emergency Medicine

## 2016-12-31 DIAGNOSIS — M7918 Myalgia, other site: Secondary | ICD-10-CM | POA: Insufficient documentation

## 2016-12-31 DIAGNOSIS — M79651 Pain in right thigh: Secondary | ICD-10-CM | POA: Insufficient documentation

## 2016-12-31 DIAGNOSIS — F17210 Nicotine dependence, cigarettes, uncomplicated: Secondary | ICD-10-CM | POA: Insufficient documentation

## 2016-12-31 DIAGNOSIS — R202 Paresthesia of skin: Secondary | ICD-10-CM | POA: Insufficient documentation

## 2016-12-31 MED ORDER — DICLOFENAC SODIUM 1 % TD GEL: 2.0000 g | Freq: Four times a day (QID) | TRANSDERMAL | 0 refills | Status: DC

## 2016-12-31 MED ORDER — IBUPROFEN 800 MG PO TABS: 800.0000 mg | ORAL_TABLET | Freq: Three times a day (TID) | ORAL | 0 refills | Status: DC

## 2016-12-31 NOTE — ED Triage Notes (Signed)
Right side thigh pain radiating down leg x 2 weeks. Pt reports he has fallen x 2 over the past 2 weeks due to right leg "giving out". nad noted.

## 2016-12-31 NOTE — ED Provider Notes (Signed)
[facility] EMERGENCY DEPARTMENT Provider Note   CSN: [id number] Arrival date & time: 12/31/16  0900     History   Chief Complaint Chief Complaint  Patient presents with  . Leg Pain    HPI Walter MinionJonathan D Blackwell is a 31 y.o. male.  HPI   Mr. Walter Blackwell is a 31yo male with a history of obesity, psychosis who presents to the emergency department for evaluation of right lateral thigh pain and tingling sensation in the anterior lower leg.  Patient states that his symptoms began about 2 weeks ago.  States that he has 8/10 constant "burning" sensation in the lateral right thigh which radiates to the anterior lower leg.  States that he has a "tingling" sensation in the anterior leg as well.  His pain is worsened with ambulation, lying on the right side.  Pain was so bad that his right leg gave out on him while he was walking a few days ago.  States that he tried taking Tylenol for his symptoms without relief.  Denies recent injury to the leg or back.  Denies fever, weight loss, night sweats, numbness, weakness, loss of bowel or bladder control, urinary retention, abdominal pain, dysuria, urinary frequency, rash or wound, SOB, chest pain.  Denies previous history of cancer, no recent surgery or immobility, no history of DVT.  Past Medical History:  Diagnosis Date  . No pertinent past medical history     Patient Active Problem List   Diagnosis Date Noted  . 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 Acute delirium 08/27/2011  . Hypercalcemia 08/27/2011  . Leukocytosis 08/27/2011  . Hyperglycemia 08/27/2011  . Obesity 08/27/2011    History reviewed. No pertinent surgical history.     Home Medications    Prior to Admission medications   Medication Sig Start Date End Date Taking? Authorizing Provider  diclofenac sodium (VOLTAREN) 1 % GEL Apply 2 g topically 4 (four) times daily. 12/31/16   Kellie ShropshireShrosbree, Philisha Weinel J, PA-C  ibuprofen (ADVIL,MOTRIN) 800 MG tablet Take 1 tablet (800  mg total) by mouth 3 (three) times daily. 12/31/16   Kellie ShropshireShrosbree, Meryn Sarracino J, PA-C    Family History No family history on file.  Social History Social History  Substance Use Topics  . Smoking status: Current Every Day Smoker    Packs/day: 0.50    Types: Cigarettes  . Smokeless tobacco: Never Used  . Alcohol use No     Allergies   Patient has no known allergies.   Review of Systems Review of Systems  Constitutional: Negative for chills, fatigue and fever.  HENT: Negative for congestion.   Eyes: Negative for visual disturbance.  Respiratory: Negative for shortness of breath.   Cardiovascular: Negative for chest pain and leg swelling.  Gastrointestinal: Negative for abdominal pain, diarrhea, nausea and vomiting.  Genitourinary: Negative for difficulty urinating, dysuria, frequency and urgency.  Musculoskeletal: Positive for gait problem (pain) and myalgias (right lateral thigh). Negative for back pain, neck pain and neck stiffness.  Skin: Negative for color change, rash and wound.  Neurological: Negative for weakness, numbness and headaches.  Psychiatric/Behavioral: Negative for agitation.     Physical Exam Updated Vital Signs BP 136/85   Pulse 75   Temp 98.1 F (36.7 C) (Oral)   Resp 16   Ht 5\' 10"  (1.778 m)   Wt 113.4 kg (250 lb)   SpO2 98%   BMI 35.87 kg/m   Physical Exam  Constitutional: He is oriented to person, place, and time. He appears  well-developed and well-nourished. No distress.  HENT:  Head: Normocephalic and atraumatic.  Mouth/Throat: Oropharynx is clear and moist. No oropharyngeal exudate.  Eyes: Pupils are equal, round, and reactive to light. Conjunctivae are normal. Right eye exhibits no discharge. Left eye exhibits no discharge.  Neck: Normal range of motion. Neck supple.  No midline cervical spine tenderness.    Cardiovascular: Normal rate, regular rhythm and intact distal pulses.  Exam reveals no friction rub.   No murmur heard. Pulmonary/Chest:  Effort normal and breath sounds normal. No respiratory distress. He has no wheezes. He has no rales.  Abdominal: Soft. Bowel sounds are normal. There is no tenderness. There is no rebound and no guarding.  Musculoskeletal: Normal range of motion.  Right lateral thigh has point tenderness to palpation. No tenderness over the inner thigh or calf. No swelling. No overlying erythema, warmth, rash, ecchymosis. Strength of hip, knee, ankle 5/5 bilaterally. DP pulses 2+ bilaterally.   Lymphadenopathy:    He has no cervical adenopathy.  Neurological: He is alert and oriented to person, place, and time. Coordination normal.  Distal sensation to light/sharp touch intact in bilateral lower extremities.  Patellar reflex 2+ bilaterally.  Gait normal in coordination and balance, although painful.  Skin: Skin is warm and dry. Capillary refill takes less than 2 seconds. He is not diaphoretic.  Psychiatric: He has a normal mood and affect. His behavior is normal.  Nursing note and vitals reviewed.    ED Treatments / Results  Labs (all labs ordered are listed, but only abnormal results are displayed) Labs Reviewed - No data to display  EKG  EKG Interpretation None       Radiology No results found.  Procedures Procedures (including critical care time)  Medications Ordered in ED Medications - No data to display   Initial Impression / Assessment and Plan / ED Course  I have reviewed the triage vital signs and the nursing notes.  Pertinent labs & imaging results that were available during my care of the patient were reviewed by me and considered in my medical decision making (see chart for details).      Patient presents with right lateral thigh "burning" sensation and anterior leg tingling for the past two weeks. No neurological deficits on exam.  Suspect that this is nerve pain from the lower back.  Will send patient home with symptomatic treatment including Voltaren gel and tylenol.  Have  also discussed RICE protocol.  Do not suspect DVT given distribution of the pain, no leg or calf swelling, no erythema or risk factors for DVT.  No warmth or skin lesion to suggest infection.   Discussed return precautions and patient agrees and voices understanding.  His blood pressure was mildly elevated in the ER today, but have provided him with information to follow-up at the [facility] for recheck of bp and follow up on thigh pain if it is not improving.  Also discussed this patient with Dr. Jacqulyn Bath who agrees with plan to discharge.   Final Clinical Impressions(s) / ED Diagnoses   Final diagnoses:  Right thigh pain     Kellie Shropshire, PA-C 12/31/16 1729    Maia Plan, MD 01/01/17 1510

## 2016-12-31 NOTE — Discharge Instructions (Signed)
The symptoms you are experiencing are consistent with nerve inflammation.   Please take 800 mg ibuprofen 3 times a day to help with inflammation and pain in your right thigh.  Please also apply Voltaren gel to the area of pain at least twice a day.  I have given you the information to [facility].  They accept patients who do not have insurance.  Please schedule an appointment to establish care for follow-up on your leg pain if it does not improve in a week with the medicine I have given you.  Return to the emergency department if you develop chest pain, shortness of breath, swelling in your right leg, numbness which you cannot feel your feet or have any new or worsening symptoms.

## 2016-12-31 NOTE — ED Notes (Signed)
Informed of wait, thanked for patience and assured that would be seen as soon as beds available

## 2018-01-11 ENCOUNTER — Emergency Department (HOSPITAL_COMMUNITY)
Admission: EM | Admit: 2018-01-11 | Discharge: 2018-01-11 | Disposition: A | Discharge status: Home / Self Care | Payer: Self-pay | Attending: Emergency Medicine | Admitting: Emergency Medicine

## 2018-01-11 ENCOUNTER — Encounter (HOSPITAL_COMMUNITY): Payer: Self-pay | Admitting: Emergency Medicine

## 2018-01-11 ENCOUNTER — Other Ambulatory Visit: Payer: Self-pay

## 2018-01-11 DIAGNOSIS — B350 Tinea barbae and tinea capitis: Secondary | ICD-10-CM

## 2018-01-11 DIAGNOSIS — F17210 Nicotine dependence, cigarettes, uncomplicated: Secondary | ICD-10-CM | POA: Insufficient documentation

## 2018-01-11 MED ORDER — KETOCONAZOLE 2 % EX SHAM: 1.0000 "application " | MEDICATED_SHAMPOO | CUTANEOUS | 0 refills | Status: AC

## 2018-01-11 NOTE — ED Triage Notes (Signed)
Pt c/o itchy rash x 1 month to top of head. Denies new shampoo/conditioner. Pt has tried OTC medication with no relief.

## 2018-01-11 NOTE — ED Provider Notes (Signed)
[facility] EMERGENCY DEPARTMENT Provider Note   CSN: [id number] Arrival date & time: 01/11/18  [id number]     History   Chief Complaint Chief Complaint  Patient presents with  . Rash    HPI Walter Blackwell is a 32 y.o. male.  The history is provided by the patient. No language interpreter was used.  Rash   This is a new problem. Episode onset: 1 month. The problem has not changed since onset.The problem is associated with nothing. There has been no fever. The rash is present on the scalp. The pain is moderate. Associated symptoms include itching. He has tried nothing for the symptoms. The treatment provided no relief.  Pt reports he has a scalp rash.  itchy  Past Medical History:  Diagnosis Date  . No pertinent past medical history     Patient Active Problem List   Diagnosis Date Noted  . 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 Acute delirium 08/27/2011  . Hypercalcemia 08/27/2011  . Leukocytosis 08/27/2011  . Hyperglycemia 08/27/2011  . Obesity 08/27/2011    History reviewed. No pertinent surgical history.      Home Medications    Prior to Admission medications   Medication Sig Start Date End Date Taking? Authorizing Provider  diclofenac sodium (VOLTAREN) 1 % GEL Apply 2 g topically 4 (four) times daily. 12/31/16   Kellie ShropshireShrosbree, Emily J, PA-C  ibuprofen (ADVIL,MOTRIN) 800 MG tablet Take 1 tablet (800 mg total) by mouth 3 (three) times daily. 12/31/16   Kellie ShropshireShrosbree, Emily J, PA-C    Family History No family history on file.  Social History Social History   Tobacco Use  . Smoking status: Current Every Day Smoker    Packs/day: 0.50    Types: Cigarettes  . Smokeless tobacco: Never Used  Substance Use Topics  . Alcohol use: Yes    Comment: socially   . Drug use: Yes    Frequency: 1.0 times per week    Types: Marijuana     Allergies   Patient has no known allergies.   Review of Systems Review of Systems  Skin: Positive for itching  and rash.  All other systems reviewed and are negative.    Physical Exam Updated Vital Signs BP (!) 143/90 (BP Location: Right Arm)   Pulse 84   Temp 98.3 F (36.8 C) (Oral)   Resp 16   Ht 5\' 10"  (1.778 m)   Wt 111.1 kg   SpO2 96%   BMI 35.15 kg/m   Physical Exam  Constitutional: He appears well-developed and well-nourished.  HENT:  Head: Normocephalic.  Scaly rash scalp  Eyes: Pupils are equal, round, and reactive to light.  Cardiovascular: Normal rate.  Pulmonary/Chest: Effort normal.  Neurological: He is alert.  Skin: Skin is warm.  Psychiatric: He has a normal mood and affect.  Nursing note and vitals reviewed.    ED Treatments / Results  Labs (all labs ordered are listed, but only abnormal results are displayed) Labs Reviewed - No data to display  EKG None  Radiology No results found.  Procedures Procedures (including critical care time)  Medications Ordered in ED Medications - No data to display   Initial Impression / Assessment and Plan / ED Course  I have reviewed the triage vital signs and the nursing notes.  Pertinent labs & imaging results that were available during my care of the patient were reviewed by me and considered in my medical decision making (see chart for details).  MDM  Possible tinea  I will try nizoral.   Pt advised to follow up with dermatology.   Final Clinical Impressions(s) / ED Diagnoses   Final diagnoses:  Tinea capitis    ED Discharge Orders         Ordered    ketoconazole (NIZORAL) 2 % shampoo  2 times weekly     01/11/18 0931        An After Visit Summary was printed and given to the patient.    Elson Areas, PA-C 01/11/18 4098    Samuel Jester, DO 01/11/18 1600

## 2020-03-24 ENCOUNTER — Emergency Department (HOSPITAL_COMMUNITY)
Admission: EM | Admit: 2020-03-24 | Discharge: 2020-03-24 | Disposition: A | Discharge status: Home / Self Care | Payer: Self-pay | Attending: Emergency Medicine | Admitting: Emergency Medicine

## 2020-03-24 ENCOUNTER — Encounter (HOSPITAL_COMMUNITY): Payer: Self-pay | Admitting: Emergency Medicine

## 2020-03-24 ENCOUNTER — Other Ambulatory Visit: Payer: Self-pay

## 2020-03-24 DIAGNOSIS — R630 Anorexia: Secondary | ICD-10-CM | POA: Insufficient documentation

## 2020-03-24 DIAGNOSIS — Z8616 Personal history of COVID-19: Secondary | ICD-10-CM | POA: Insufficient documentation

## 2020-03-24 DIAGNOSIS — F17210 Nicotine dependence, cigarettes, uncomplicated: Secondary | ICD-10-CM | POA: Insufficient documentation

## 2020-03-24 NOTE — ED Notes (Addendum)
Entered room and introduced self to patient. Pt appears to be resting in bed, respirations are even and unlabored with equal chest rise and fall. Bed is locked in the lowest position, side rails x2, call bell within reach. Pt educated on call light use and hourly rounding, verbalized understanding and in agreement at this time. All questions and concerns voiced addressed. Refreshments offered and provided per patient request.  

## 2020-03-24 NOTE — ED Triage Notes (Signed)
Pt states he had covid two week ago. Had a negative covid test yesterday.   States " I just feel like I have no appetite. I get anxiety when I think about food or when its time to eat."  Denies any n/v/d and was able to get yesterday

## 2020-03-24 NOTE — ED Provider Notes (Signed)
[facility] EMERGENCY DEPARTMENT Provider Note   CSN: [id number] Arrival date & time: 03/24/20  0957     History Chief Complaint  Patient presents with  . Other    Walter Blackwell is a 35 y.o. male who presents to the ED today with complaint of loss of appetite for the past 3-4 days. Pt reports that he was recently diagnosed with COVID 12 days ago. He was having some diarrhea with his COVID however no other complaints however began having loss of appetite 3 days ago. Pt reports that he is hungry however when he thinks about food he feels anxious as he starts thinking about his son having COVID as well. He was able to eat 2 slices of pizza yesterday without issue and states he is hungry now but hasn't tried to eat anything today. He denies any nausea, vomiting, abdominal pain. His diarrhea has resolved. Denies unexpected weight loss. Has been drinking without difficulty.   The history is provided by the patient and medical records.       Past Medical History:  Diagnosis Date  . No pertinent past medical history     Patient Active Problem List   Diagnosis Date Noted  . 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 Acute delirium 08/27/2011  . Hypercalcemia 08/27/2011  . Leukocytosis 08/27/2011  . Hyperglycemia 08/27/2011  . Obesity 08/27/2011    History reviewed. No pertinent surgical history.     History reviewed. No pertinent family history.  Social History   Tobacco Use  . Smoking status: Current Every Day Smoker    Packs/day: 0.50    Types: Cigarettes  . Smokeless tobacco: Never Used  Vaping Use  . Vaping Use: Never used  Substance Use Topics  . Alcohol use: Yes    Comment: socially   . Drug use: Yes    Frequency: 1.0 times per week    Types: Marijuana    Home Medications Prior to Admission medications   Medication Sig Start Date End Date Taking? Authorizing Provider  diclofenac sodium (VOLTAREN) 1 % GEL Apply 2 g topically 4 (four)  times daily. 12/31/16   Kellie Shropshire, PA-C  ibuprofen (ADVIL,MOTRIN) 800 MG tablet Take 1 tablet (800 mg total) by mouth 3 (three) times daily. 12/31/16   Kellie Shropshire, PA-C    Allergies    Patient has no known allergies.  Review of Systems   Review of Systems  Constitutional: Positive for appetite change. Negative for chills and fever.  Gastrointestinal: Positive for diarrhea (resolved). Negative for abdominal pain, nausea and vomiting.  All other systems reviewed and are negative.   Physical Exam Updated Vital Signs BP (!) 150/85 (BP Location: Right Arm)   Pulse (!) 107   Temp 98.3 F (36.8 C) (Oral)   Resp 18   Ht 5\' 10"  (1.778 m)   Wt 83.9 kg   SpO2 98%   BMI 26.54 kg/m   Physical Exam Vitals and nursing note reviewed.  Constitutional:      Appearance: He is not ill-appearing.  HENT:     Head: Normocephalic and atraumatic.     Mouth/Throat:     Mouth: Mucous membranes are moist.  Eyes:     Conjunctiva/sclera: Conjunctivae normal.  Cardiovascular:     Rate and Rhythm: Normal rate and regular rhythm.     Pulses: Normal pulses.  Pulmonary:     Effort: Pulmonary effort is normal.     Breath sounds: Normal breath sounds. No wheezing, rhonchi  or rales.  Abdominal:     Tenderness: There is no abdominal tenderness. There is no guarding or rebound.  Skin:    General: Skin is warm and dry.     Coloration: Skin is not jaundiced.  Neurological:     Mental Status: He is alert.     ED Results / Procedures / Treatments   Labs (all labs ordered are listed, but only abnormal results are displayed) Labs Reviewed - No data to display  EKG None  Radiology No results found.  Procedures Procedures   Medications Ordered in ED Medications - No data to display  ED Course  I have reviewed the triage vital signs and the nursing notes.  Pertinent labs & imaging results that were available during my care of the patient were reviewed by me and considered in my  medical decision making (see chart for details).    MDM Rules/Calculators/A&P                          35 year old male presents to the ED today with complaint of loss of appetite for the past 3 days.  Was recently diagnosed with Covid, has resolved from this.  Had diarrhea during his infection, none currently.  Was able to eat pizza yesterday without difficulty.  On arrival to the ED vitals are stable.  Patient is mildly tachycardic on arrival at 107 however this is dissipated while in the room.  He is afebrile nontachypneic and appears to be in no acute distress.  On exam patient is overall well-appearing.  He has no abdominal tenderness palpation.  He has active bowel sounds throughout.  He has moist mucous membranes.  Does not appear dehydrated.  Has been able to drink water without difficulty.  He does state he feels hungry now however has not tried to eat anything as he wanted to come to the ED for further evaluation.  Has not followed up with his PCP.  I do not feel patient needs any labs or imaging at this time.  His exam is benign.  He has moist mucous membranes and I do not feel he requires IV fluids.  I do suspect that his loss of appetite is somewhat related to anxiety as he does report that he continues to think about when he had Covid now that his son has Covid as well.  I have instructed that he try to do a bland diet first and see how this is on his stomach, I suspect pizza may have been too heavy.  He is encouraged to eat small amounts throughout the day.  I have also discussed Culturelle over-the-counter to help with his natural gut flora given he was having diarrhea during his Covid infection.  Patient is instructed to follow-up with his PCP for same.  He is in agreement with plan stable for discharge home.  This note was prepared using Dragon voice recognition software and may include unintentional dictation errors due to the inherent limitations of voice recognition software.  Walter Blackwell was evaluated in Emergency Department on 03/24/2020 for the symptoms described in the history of present illness. He was evaluated in the context of the global COVID-19 pandemic, which necessitated consideration that the patient might be at risk for infection with the SARS-CoV-2 virus that causes COVID-19. Institutional protocols and algorithms that pertain to the evaluation of patients at risk for COVID-19 are in a state of rapid change based on information released by regulatory  bodies including the CDC and federal and state organizations. These policies and algorithms were followed during the patient's care in the ED.   Final Clinical Impression(s) / ED Diagnoses Final diagnoses:  Loss of appetite    Rx / DC Orders ED Discharge Orders    None       Discharge Instructions     Please follow up with your PCP regarding your ED visit. If you do not have one you can follow up with [facility] for primary care needs.   I would recommend buying Culturelle OTC to help increase your natural gut flora as this may be causing you to not want to eat as much. I would also recommend a bland diet for the next few days with eating small amounts multiple times throughout the day. Attached is additional information on the topic. Continue drinking plenty of fluids to stay hydrated.   Return to the ED for any worsening symptoms       Tanda Rockers, PA-C 03/24/20 1108    Vanetta Mulders, MD 04/06/20 [phone]

## 2020-03-24 NOTE — Discharge Instructions (Signed)
Please follow up with your PCP regarding your ED visit. If you do not have one you can follow up with [facility] for primary care needs.   I would recommend buying Culturelle OTC to help increase your natural gut flora as this may be causing you to not want to eat as much. I would also recommend a bland diet for the next few days with eating small amounts multiple times throughout the day. Attached is additional information on the topic. Continue drinking plenty of fluids to stay hydrated.   Return to the ED for any worsening symptoms

## 2020-04-08 ENCOUNTER — Emergency Department (HOSPITAL_COMMUNITY)
Admission: EM | Admit: 2020-04-08 | Discharge: 2020-04-08 | Disposition: A | Discharge status: Home / Self Care | Payer: Self-pay | Attending: Emergency Medicine | Admitting: Emergency Medicine

## 2020-04-08 ENCOUNTER — Encounter (HOSPITAL_COMMUNITY): Payer: Self-pay | Admitting: *Deleted

## 2020-04-08 ENCOUNTER — Other Ambulatory Visit: Payer: Self-pay

## 2020-04-08 ENCOUNTER — Emergency Department (HOSPITAL_COMMUNITY): Payer: Self-pay

## 2020-04-08 DIAGNOSIS — F17210 Nicotine dependence, cigarettes, uncomplicated: Secondary | ICD-10-CM | POA: Insufficient documentation

## 2020-04-08 DIAGNOSIS — K625 Hemorrhage of anus and rectum: Secondary | ICD-10-CM | POA: Insufficient documentation

## 2020-04-08 LAB — COMPREHENSIVE METABOLIC PANEL
ALT: 25 U/L (ref 0–44)
AST: 23 U/L (ref 15–41)
Albumin: 4.7 g/dL (ref 3.5–5.0)
Alkaline Phosphatase: 74 U/L (ref 38–126)
Anion gap: 12 (ref 5–15)
BUN: 13 mg/dL (ref 6–20)
CO2: 22 mmol/L (ref 22–32)
Calcium: 9.8 mg/dL (ref 8.9–10.3)
Chloride: 105 mmol/L (ref 98–111)
Creatinine, Ser: 0.9 mg/dL (ref 0.61–1.24)
GFR, Estimated: >60 mL/min (ref >60)
Glucose, Bld: 111 mg/dL — ABNORMAL HIGH (ref 70–99)
Potassium: 3.8 mmol/L (ref 3.5–5.1)
Sodium: 139 mmol/L (ref 135–145)
Total Bilirubin: 1.3 mg/dL — ABNORMAL HIGH (ref 0.3–1.2)
Total Protein: 7.8 g/dL (ref 6.5–8.1)

## 2020-04-08 LAB — CBC WITH DIFFERENTIAL/PLATELET
Abs Immature Granulocytes: 0.03 10*3/uL (ref 0.00–0.07)
Basophils Absolute: 0 10*3/uL (ref 0.0–0.1)
Basophils Relative: 0 %
Eosinophils Absolute: 0 10*3/uL (ref 0.0–0.5)
Eosinophils Relative: 0 %
HCT: 45.2 % (ref 39.0–52.0)
Hemoglobin: 14.9 g/dL (ref 13.0–17.0)
Immature Granulocytes: 0 %
Lymphocytes Relative: 18 %
Lymphs Abs: 1.7 10*3/uL (ref 0.7–4.0)
MCH: 28.3 pg (ref 26.0–34.0)
MCHC: 33 g/dL (ref 30.0–36.0)
MCV: 85.8 fL (ref 80.0–100.0)
Monocytes Absolute: 0.7 10*3/uL (ref 0.1–1.0)
Monocytes Relative: 8 %
Neutro Abs: 7 10*3/uL (ref 1.7–7.7)
Neutrophils Relative %: 74 %
Platelets: 183 10*3/uL (ref 150–400)
RBC: 5.27 MIL/uL (ref 4.22–5.81)
RDW: 13.8 % (ref 11.5–15.5)
WBC: 9.4 10*3/uL (ref 4.0–10.5)
nRBC: 0 % (ref 0.0–0.2)

## 2020-04-08 LAB — POC OCCULT BLOOD, ED: Fecal Occult Bld: POSITIVE — AB

## 2020-04-08 MED ORDER — IOHEXOL 300 MG/ML  SOLN
100.0000 mL | Freq: Once | INTRAMUSCULAR | Status: AC | PRN
  Administered 2020-04-08: 100 mL via INTRAVENOUS

## 2020-04-08 NOTE — ED Provider Notes (Signed)
[facility] EMERGENCY DEPARTMENT Provider Note   CSN: [id number] Arrival date & time: 04/08/20  9485     History Chief Complaint  Patient presents with  . Abdominal Pain    Walter Blackwell is a 35 y.o. male.  Patient with some rectal bleeding with some abdominal discomfort  The history is provided by the patient and medical records. No language interpreter was used.  Abdominal Pain Pain location:  Generalized Pain quality: aching   Pain radiates to:  Does not radiate Pain severity:  Mild Onset quality:  Sudden Timing:  Intermittent Progression:  Waxing and waning Chronicity:  New Context: not alcohol use   Associated symptoms: no chest pain, no cough, no diarrhea, no fatigue and no hematuria        Past Medical History:  Diagnosis Date  . No pertinent past medical history     Patient Active Problem List   Diagnosis Date Noted  . 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 Acute delirium 08/27/2011  . Hypercalcemia 08/27/2011  . Leukocytosis 08/27/2011  . Hyperglycemia 08/27/2011  . Obesity 08/27/2011    History reviewed. No pertinent surgical history.     No family history on file.  Social History   Tobacco Use  . Smoking status: Current Every Day Smoker    Packs/day: 0.50    Types: Cigarettes  . Smokeless tobacco: Never Used  Vaping Use  . Vaping Use: Never used  Substance Use Topics  . Alcohol use: Yes    Comment: socially   . Drug use: Yes    Frequency: 1.0 times per week    Types: Marijuana    Home Medications Prior to Admission medications   Medication Sig Start Date End Date Taking? Authorizing Provider  diclofenac sodium (VOLTAREN) 1 % GEL Apply 2 g topically 4 (four) times daily. 12/31/16   Kellie Shropshire, PA-C  ibuprofen (ADVIL,MOTRIN) 800 MG tablet Take 1 tablet (800 mg total) by mouth 3 (three) times daily. 12/31/16   Kellie Shropshire, PA-C    Allergies    Patient has no known allergies.  Review of  Systems   Review of Systems  Constitutional: Negative for appetite change and fatigue.  HENT: Negative for congestion, ear discharge and sinus pressure.   Eyes: Negative for discharge.  Respiratory: Negative for cough.   Cardiovascular: Negative for chest pain.  Gastrointestinal: Positive for abdominal pain and anal bleeding. Negative for diarrhea.  Genitourinary: Negative for frequency and hematuria.  Musculoskeletal: Negative for back pain.  Skin: Negative for rash.  Neurological: Negative for seizures and headaches.  Psychiatric/Behavioral: Negative for hallucinations.    Physical Exam Updated Vital Signs BP (!) 143/78 (BP Location: Right Wrist)   Pulse (!) 102   Temp 99 F (37.2 C)   Resp 14   Ht 5\' 11"  (1.803 m)   Wt 83.9 kg   SpO2 99%   BMI 25.80 kg/m   Physical Exam Vitals reviewed.  Constitutional:      Appearance: He is well-developed.  HENT:     Head: Normocephalic.     Nose: Nose normal.     Mouth/Throat:     Mouth: Mucous membranes are moist.  Eyes:     General: No scleral icterus.    Extraocular Movements: EOM normal.     Conjunctiva/sclera: Conjunctivae normal.  Neck:     Thyroid: No thyromegaly.  Cardiovascular:     Rate and Rhythm: Normal rate and regular rhythm.     Heart sounds: No  murmur heard. No friction rub. No gallop.   Pulmonary:     Breath sounds: No stridor. No wheezing or rales.  Chest:     Chest wall: No tenderness.  Abdominal:     General: There is no distension.     Tenderness: There is no abdominal tenderness. There is no rebound.  Genitourinary:    Comments: Rectal hem pos.  Brown stool Musculoskeletal:        General: No edema. Normal range of motion.     Cervical back: Neck supple.  Lymphadenopathy:     Cervical: No cervical adenopathy.  Skin:    Findings: No erythema or rash.  Neurological:     Mental Status: He is alert and oriented to person, place, and time.     Motor: No abnormal muscle tone.     Coordination:  Coordination normal.  Psychiatric:        Mood and Affect: Mood and affect normal.        Behavior: Behavior normal.     ED Results / Procedures / Treatments   Labs (all labs ordered are listed, but only abnormal results are displayed) Labs Reviewed  COMPREHENSIVE METABOLIC PANEL - Abnormal; Notable for the following components:      Result Value   Glucose, Bld 111 (*)    Total Bilirubin 1.3 (*)    All other components within normal limits  POC OCCULT BLOOD, ED - Abnormal; Notable for the following components:   Fecal Occult Bld POSITIVE (*)    All other components within normal limits  CBC WITH DIFFERENTIAL/PLATELET  OCCULT BLOOD X 1 CARD TO LAB, STOOL    EKG None  Radiology CT ABDOMEN PELVIS W CONTRAST  Result Date: 04/08/2020 CLINICAL DATA:  Abdominal pain and diarrhea EXAM: CT ABDOMEN AND PELVIS WITH CONTRAST TECHNIQUE: Multidetector CT imaging of the abdomen and pelvis was performed using the standard protocol following bolus administration of intravenous contrast. CONTRAST:  OMNIPAQUE IOHEXOL 300 MG/ML  SOLN COMPARISON:  None. FINDINGS: Lower chest: Lung bases are clear. Hepatobiliary: No focal liver lesions are apparent. Gallbladder wall is not appreciably thickened. There is no biliary duct dilatation. Pancreas: There is no pancreatic mass or inflammatory focus. Spleen: No splenic lesions are evident. Adrenals/Urinary Tract: Adrenals bilaterally appear normal. There is no evident renal mass or hydronephrosis on either side. There is no evident renal or ureteral calculus on either side. Urinary bladder is midline with wall thickness within normal limits. Stomach/Bowel: There is no appreciable bowel wall or mesenteric thickening. Terminal ileum appears normal. No evident bowel obstruction. There is no appreciable free air or portal venous air. Vascular/Lymphatic: There is no abdominal aortic aneurysm. No arterial vascular lesions are evident. Major venous structures appear  patent. There is no evident adenopathy in the abdomen or pelvis. Reproductive: Prostate and seminal vesicles are normal in size and contour. No evident pelvic mass. Other: Appendix appears normal. There is no evident ascites or abscess in the abdomen or pelvis. Musculoskeletal: No blastic or lytic bone lesions. There is evidence of a degree of sacroiliitis, more on the left than the right. No intramuscular or abdominal wall lesions are evident. None IMPRESSION: 1. There is a degree of sacroiliitis bilaterally, more notable on the left than on the right. Etiology for this sacroiliitis uncertain. This finding potentially could indicate seronegative spondyloarthropathy. 2. No evident bowel wall thickening or bowel obstruction. Terminal ileum appears normal. No abscess in the abdomen or pelvis. Appendix appears normal. 3. No evident renal or ureteral  calculus. No hydronephrosis. Urinary bladder wall thickness normal. Electronically Signed   By: Bretta Bang III M.D.   On: 04/08/2020 14:25    Procedures Procedures   Medications Ordered in ED Medications  iohexol (OMNIPAQUE) 300 MG/ML solution 100 mL (100 mLs Intravenous Contrast Given 04/08/20 1342)    ED Course  I have reviewed the triage vital signs and the nursing notes.  Pertinent labs & imaging results that were available during my care of the patient were reviewed by me and considered in my medical decision making (see chart for details).    MDM Rules/Calculators/A&P                         Patient with heme positive rectal bleeding.  CT scan unremarkable except for sacroiliitis.  He is referred to GI for the rectal bleeding and orthopedics for the sacroiliitis Final Clinical Impression(s) / ED Diagnoses Final diagnoses:  Rectal bleeding    Rx / DC Orders ED Discharge Orders    None       Bethann Berkshire, MD 04/13/20 1100

## 2020-04-08 NOTE — ED Notes (Signed)
Dr Estell Harpin canceled AMA because results finally in.

## 2020-04-08 NOTE — Discharge Instructions (Addendum)
Follow-up with Dr. Levon Hedger in 1 week for your rectal bleeding return sooner if problems,   also follow-up with Dr. Romeo Apple for possible sacroiliitis

## 2020-04-08 NOTE — ED Notes (Signed)
Not able to get VS, Pt in rush to get child from child at 2:30, Dr Estell Harpin aware.

## 2020-04-08 NOTE — ED Triage Notes (Signed)
Abdominal pain x 1 week, states he saw some blood when having a bowel movement today

## 2020-06-18 ENCOUNTER — Other Ambulatory Visit: Payer: Self-pay

## 2020-06-18 ENCOUNTER — Emergency Department (HOSPITAL_COMMUNITY)
Admission: EM | Admit: 2020-06-18 | Discharge: 2020-06-18 | Disposition: A | Discharge status: Home / Self Care | Payer: Self-pay | Attending: Emergency Medicine | Admitting: Emergency Medicine

## 2020-06-18 ENCOUNTER — Encounter (HOSPITAL_COMMUNITY): Payer: Self-pay

## 2020-06-18 ENCOUNTER — Emergency Department (HOSPITAL_COMMUNITY): Payer: Self-pay

## 2020-06-18 DIAGNOSIS — R1084 Generalized abdominal pain: Secondary | ICD-10-CM | POA: Insufficient documentation

## 2020-06-18 DIAGNOSIS — M461 Sacroiliitis, not elsewhere classified: Secondary | ICD-10-CM | POA: Insufficient documentation

## 2020-06-18 DIAGNOSIS — F17210 Nicotine dependence, cigarettes, uncomplicated: Secondary | ICD-10-CM | POA: Insufficient documentation

## 2020-06-18 LAB — CBC WITH DIFFERENTIAL/PLATELET
Abs Immature Granulocytes: 0.02 10*3/uL (ref 0.00–0.07)
Basophils Absolute: 0 10*3/uL (ref 0.0–0.1)
Basophils Relative: 0 %
Eosinophils Absolute: 0 10*3/uL (ref 0.0–0.5)
Eosinophils Relative: 0 %
HCT: 45.3 % (ref 39.0–52.0)
Hemoglobin: 14.4 g/dL (ref 13.0–17.0)
Immature Granulocytes: 0 %
Lymphocytes Relative: 27 %
Lymphs Abs: 2.2 10*3/uL (ref 0.7–4.0)
MCH: 27.9 pg (ref 26.0–34.0)
MCHC: 31.8 g/dL (ref 30.0–36.0)
MCV: 87.6 fL (ref 80.0–100.0)
Monocytes Absolute: 0.5 10*3/uL (ref 0.1–1.0)
Monocytes Relative: 6 %
Neutro Abs: 5.4 10*3/uL (ref 1.7–7.7)
Neutrophils Relative %: 67 %
Platelets: 193 10*3/uL (ref 150–400)
RBC: 5.17 MIL/uL (ref 4.22–5.81)
RDW: 14.6 % (ref 11.5–15.5)
WBC: 8.2 10*3/uL (ref 4.0–10.5)
nRBC: 0 % (ref 0.0–0.2)

## 2020-06-18 LAB — COMPREHENSIVE METABOLIC PANEL
ALT: 30 U/L (ref 0–44)
AST: 24 U/L (ref 15–41)
Albumin: 4.4 g/dL (ref 3.5–5.0)
Alkaline Phosphatase: 73 U/L (ref 38–126)
Anion gap: 8 (ref 5–15)
BUN: 10 mg/dL (ref 6–20)
CO2: 26 mmol/L (ref 22–32)
Calcium: 9.6 mg/dL (ref 8.9–10.3)
Chloride: 106 mmol/L (ref 98–111)
Creatinine, Ser: 0.92 mg/dL (ref 0.61–1.24)
GFR, Estimated: >60 mL/min (ref >60)
Glucose, Bld: 110 mg/dL — ABNORMAL HIGH (ref 70–99)
Potassium: 4 mmol/L (ref 3.5–5.1)
Sodium: 140 mmol/L (ref 135–145)
Total Bilirubin: 0.5 mg/dL (ref 0.3–1.2)
Total Protein: 7.5 g/dL (ref 6.5–8.1)

## 2020-06-18 LAB — URINALYSIS, ROUTINE W REFLEX MICROSCOPIC
Bacteria, UA: NONE SEEN
Bilirubin Urine: NEGATIVE
Glucose, UA: NEGATIVE mg/dL
Ketones, ur: NEGATIVE mg/dL
Leukocytes,Ua: NEGATIVE
Nitrite: NEGATIVE
Protein, ur: NEGATIVE mg/dL
Specific Gravity, Urine: 1.006 (ref 1.005–1.030)
pH: 7 (ref 5.0–8.0)

## 2020-06-18 LAB — LIPASE, BLOOD: Lipase: 28 U/L (ref 11–51)

## 2020-06-18 MED ORDER — IOHEXOL 300 MG/ML  SOLN
100.0000 mL | Freq: Once | INTRAMUSCULAR | Status: AC | PRN
  Administered 2020-06-18: 100 mL via INTRAVENOUS

## 2020-06-18 MED ORDER — FAMOTIDINE 20 MG PO TABS: 20.0000 mg | ORAL_TABLET | Freq: Two times a day (BID) | ORAL | 0 refills | Status: DC

## 2020-06-18 NOTE — ED Provider Notes (Addendum)
[facility] EMERGENCY DEPARTMENT Provider Note   CSN: [id number] Arrival date & time: 06/18/20  0831     History Chief Complaint  Patient presents with  . Abdominal Pain    Walter Blackwell is a 35 y.o. male.  Patient with a complaint of bilateral flank pain and generalized abdominal pain for 1 month.  Pain does radiate into the back area.  Patient denies any nausea vomiting or diarrhea.  No fever.  No dysuria or hematuria.        Past Medical History:  Diagnosis Date  . No pertinent past medical history     Patient Active Problem List   Diagnosis Date Noted  . 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 Acute delirium 08/27/2011  . Hypercalcemia 08/27/2011  . Leukocytosis 08/27/2011  . Hyperglycemia 08/27/2011  . Obesity 08/27/2011    Past Surgical History:  Procedure Laterality Date  . HAND SURGERY         No family history on file.  Social History   Tobacco Use  . Smoking status: Current Every Day Smoker    Packs/day: 0.50    Types: Cigarettes  . Smokeless tobacco: Never Used  Vaping Use  . Vaping Use: Never used  Substance Use Topics  . Alcohol use: Yes    Comment: socially   . Drug use: Yes    Frequency: 1.0 times per week    Types: Marijuana    Home Medications Prior to Admission medications   Medication Sig Start Date End Date Taking? Authorizing Provider  VITAMIN D PO Take 2,000 Units by mouth daily.   Yes [provider]  diclofenac sodium (VOLTAREN) 1 % GEL Apply 2 g topically 4 (four) times daily. Patient not taking: Reported on 06/18/2020 12/31/16   Kellie Shropshire, PA-C  ibuprofen (ADVIL,MOTRIN) 800 MG tablet Take 1 tablet (800 mg total) by mouth 3 (three) times daily. Patient not taking: Reported on 06/18/2020 12/31/16   Kellie Shropshire, PA-C    Allergies    Patient has no known allergies.  Review of Systems   Review of Systems  Constitutional: Negative for chills and fever.  HENT: Negative for  rhinorrhea and sore throat.   Eyes: Negative for visual disturbance.  Respiratory: Negative for cough and shortness of breath.   Cardiovascular: Negative for chest pain and leg swelling.  Gastrointestinal: Positive for abdominal pain. Negative for diarrhea, nausea and vomiting.  Genitourinary: Positive for flank pain. Negative for dysuria.  Musculoskeletal: Positive for back pain. Negative for neck pain.  Skin: Negative for rash.  Neurological: Negative for dizziness, light-headedness and headaches.  Hematological: Does not bruise/bleed easily.  Psychiatric/Behavioral: Negative for confusion.    Physical Exam Updated Vital Signs BP (!) 169/97 (BP Location: Right Arm)   Pulse (!) 106   Temp 97.8 F (36.6 C) (Oral)   Resp 20   Ht 1.803 m (5\' 11" )   Wt 97.5 kg   SpO2 100%   BMI 29.99 kg/m   Physical Exam Vitals and nursing note reviewed.  Constitutional:      General: He is not in acute distress.    Appearance: Normal appearance. He is well-developed.  HENT:     Head: Normocephalic and atraumatic.  Eyes:     Extraocular Movements: Extraocular movements intact.     Conjunctiva/sclera: Conjunctivae normal.     Pupils: Pupils are equal, round, and reactive to light.  Cardiovascular:     Rate and Rhythm: Normal rate and regular rhythm.  Heart sounds: No murmur heard.   Pulmonary:     Effort: Pulmonary effort is normal. No respiratory distress.     Breath sounds: Normal breath sounds.  Abdominal:     General: There is no distension.     Palpations: Abdomen is soft. There is no mass.     Tenderness: There is no abdominal tenderness.  Musculoskeletal:        General: Normal range of motion.     Cervical back: Neck supple.  Skin:    General: Skin is warm and dry.     Capillary Refill: Capillary refill takes less than 2 seconds.  Neurological:     General: No focal deficit present.     Mental Status: He is alert and oriented to person, place, and time.     Cranial  Nerves: No cranial nerve deficit.     Sensory: No sensory deficit.     ED Results / Procedures / Treatments   Labs (all labs ordered are listed, but only abnormal results are displayed) Labs Reviewed  COMPREHENSIVE METABOLIC PANEL - Abnormal; Notable for the following components:      Result Value   Glucose, Bld 110 (*)    All other components within normal limits  URINALYSIS, ROUTINE W REFLEX MICROSCOPIC - Abnormal; Notable for the following components:   Color, Urine STRAW (*)    Hgb urine dipstick SMALL (*)    All other components within normal limits  CBC WITH DIFFERENTIAL/PLATELET  LIPASE, BLOOD    EKG None  Radiology CT Abdomen Pelvis W Contrast  Result Date: 06/18/2020 CLINICAL DATA:  Generalized abdominal pain for 1 month EXAM: CT ABDOMEN AND PELVIS WITH CONTRAST TECHNIQUE: Multidetector CT imaging of the abdomen and pelvis was performed using the standard protocol following bolus administration of intravenous contrast. CONTRAST:  OMNIPAQUE IOHEXOL 300 MG/ML  SOLN COMPARISON:  04/08/2020 FINDINGS: Lower chest: Included lung bases are clear.  Heart size is normal. Hepatobiliary: No focal liver abnormality is seen. No gallstones, gallbladder wall thickening, or biliary dilatation. Pancreas: Unremarkable. No pancreatic ductal dilatation or surrounding inflammatory changes. Spleen: Normal in size without focal abnormality. Adrenals/Urinary Tract: Unremarkable adrenal glands. Kidneys enhance symmetrically without focal lesion, stone, or hydronephrosis. Ureters are nondilated. Urinary bladder appears unremarkable for the degree of distension. Stomach/Bowel: Stomach is within normal limits. Appendix appears normal (series 2, image 54). No evidence of bowel wall thickening, distention, or inflammatory changes. Vascular/Lymphatic: No significant vascular findings are present. No enlarged abdominal or pelvic lymph nodes. Reproductive: Prostate is unremarkable. Other: No free fluid. No  abdominopelvic fluid collection. No pneumoperitoneum. No abdominal wall hernia. Musculoskeletal: 1.3 cm rounded subcutaneous nodule posterior to the T9 level, left paramidline. Similar degree of sclerosis associated with the bilateral SI joints, right greater than left, which may reflect sequela of sacroiliitis. Osseous structures appear otherwise unremarkable. IMPRESSION: 1. No acute abdominopelvic findings. 2. Rounded 1.3 cm subcutaneous nodule posterior to the T9 level, left paramidline. Findings may represent a sebaceous cyst. Correlation with physical exam is recommended. 3. Similar degree of sclerosis associated with the bilateral SI joints, right greater than left, which may reflect sequela of sacroiliitis. Electronically Signed   By: Duanne Guess D.O.   On: 06/18/2020 11:17    Procedures Procedures   Medications Ordered in ED Medications  iohexol (OMNIPAQUE) 300 MG/ML solution 100 mL (100 mLs Intravenous Contrast Given 06/18/20 1031)    ED Course  I have reviewed the triage vital signs and the nursing notes.  Pertinent labs &  imaging results that were available during my care of the patient were reviewed by me and considered in my medical decision making (see chart for details).    MDM Rules/Calculators/A&P                          Chart review shows that patient had CT scan of the abdomen in February without any acute findings.  When requestioning patient patient states that he had the same pain then.  He felt that he needed an ultrasound.  I told him that ultrasound is very helpful for evaluating the gallbladder.  But if he is having persistent pain CT scan covers the abdomen more broadly.  If it shows some concerns with the gallbladder then ultrasound would be appropriate.  Patient may require referral to GI medicine.  This may be a chronic problem.  CT scan of the abdomen in February did raise some question of sacroiliitis.  But I would not think that that would give patient  anterior abdominal pain.  Patient currently does not have a primary care doctor does not have insurance.  Repeat CT scan here today without any acute findings.  Again this raises some concerns about sacroiliitis.  But I would not think that this would be causing his pain.  No evidence of any gallbladder abnormalities.  No evidence of any ureter abnormalities.  No leukocytosis urine normal electrolytes and liver function tests normal.  Lipase normal.  Do not see indication for right upper quadrant ultrasound.  Patient's pain is also atypical for gallbladder problems.  We will go ahead and start on Pepcid given referral to GI medicine.  And refer patient to [facility] clinic for further work-up of the sacroiliitis.    Final Clinical Impression(s) / ED Diagnoses Final diagnoses:  Generalized abdominal pain  Sacroiliitis [facility])    Rx / DC Orders ED Discharge Orders    None       Vanetta Mulders, MD 06/18/20 1202    Vanetta Mulders, MD 06/18/20 2409    Vanetta Mulders, MD 06/18/20 1208

## 2020-06-18 NOTE — ED Notes (Addendum)
Pt reports having bilateral side pain for one month intermittently.  C/o abdominal pain for about 2 weeks with bloating.  BM have been normal, regular and brown in color.

## 2020-06-18 NOTE — ED Triage Notes (Signed)
Pt presents to ED with complaints of generalized abdominal pain shooting to both sides and into back x about 1 month. Denies N/V/D or urinary symptoms.

## 2020-06-18 NOTE — Discharge Instructions (Signed)
Take the Pepcid as directed for the next 2 weeks.  Make an appointment to follow-up with the free clinic for further evaluation of the sacroiliitis.  Also referral provided to GI medicine for consideration for gastroenterology input or may be consideration for upper endoscopy.  Work-up here today with CT scan showing some sacroiliitis.  No other acute abdominal process.  Labs liver function test lipase and no leukocytosis.

## 2020-07-23 NOTE — Congregational Nurse Program (Signed)
Client in today to enroll in Care connect at [facility].  Client lives with fiance'and is is not currently employed and does not have health insurance at this time.  Client was recently seen at [facility] ER with abdominal pain, labs and CT scan performed and client per discharge notes was referred to follow up with a primary care provider. Client was prescribed a course of pepcid 20 mg to be taken one tablet twice a day. He reports that did help his abdominal pain, but pain has returned since having no more medications.  NKDA  Client wishes to establish primary medical care with [facility]. Appointment secured for 07/31/20 at 0900. Client given appointment card with phone number and address. Discussed location of clinic. Case manager's contact card given with phone number.  Plan: referral to primary care provider in [facility].  Will plan follow up by phone after the first visit at [facility].   Reviewed SDOHs client has no issues with transportation to his medical appointments and reports no food or housing needs. He states he will have transportation for his appointment.  Discussed with client that Care Connect is here to assist in any resources he may need. Client reports understanding.   Francee Nodal RN [employer]

## 2020-07-31 ENCOUNTER — Other Ambulatory Visit: Payer: Self-pay

## 2020-07-31 ENCOUNTER — Telehealth: Payer: Self-pay

## 2020-07-31 ENCOUNTER — Encounter: Payer: Self-pay | Admitting: Physician Assistant

## 2020-07-31 ENCOUNTER — Ambulatory Visit: Payer: Self-pay | Admitting: Physician Assistant

## 2020-07-31 VITALS — BP 157/88 | HR 90 | Temp 97.4°F

## 2020-07-31 DIAGNOSIS — Z13220 Encounter for screening for lipoid disorders: Secondary | ICD-10-CM

## 2020-07-31 DIAGNOSIS — B350 Tinea barbae and tinea capitis: Secondary | ICD-10-CM

## 2020-07-31 DIAGNOSIS — Z7689 Persons encountering health services in other specified circumstances: Secondary | ICD-10-CM

## 2020-07-31 DIAGNOSIS — I10 Essential (primary) hypertension: Secondary | ICD-10-CM

## 2020-07-31 DIAGNOSIS — Z131 Encounter for screening for diabetes mellitus: Secondary | ICD-10-CM

## 2020-07-31 DIAGNOSIS — F1290 Cannabis use, unspecified, uncomplicated: Secondary | ICD-10-CM

## 2020-07-31 DIAGNOSIS — R7309 Other abnormal glucose: Secondary | ICD-10-CM

## 2020-07-31 MED ORDER — FLUCONAZOLE 50 MG PO TABS: 50.0000 mg | ORAL_TABLET | Freq: Every day | ORAL | 0 refills | Status: DC

## 2020-07-31 MED ORDER — LISINOPRIL 10 MG PO TABS: 10.0000 mg | ORAL_TABLET | Freq: Every day | ORAL | 1 refills | Status: DC

## 2020-07-31 NOTE — Progress Notes (Signed)
BP (!) 157/88   Pulse 90   Temp (!) 97.4 F (36.3 C)   SpO2 99%    Subjective:    Patient ID: Walter Blackwell, male    DOB: [birth year], 35 y.o.   MRN: [id number]  HPI: Walter Blackwell is a 35 y.o. male presenting on 07/31/2020 for No chief complaint on file.   HPI   Pt had a negative covid 19 screening questionnaire.   Pt is 35yoM who presents to establish care.  Pt has Not been vaccinated for covid  CT abd x 2 this year which showed sacroilities,  Sebaceous cyst.  He was told to f/u with orthopedics but he didn't.  He says the pain is there every now and then but is much better  He says he sometimes has pains in his stomach - 1 or 2 times/week.  It lasts a few minutes.  He thinks it's gas.  He has seen blood in stool once since he was seen for that in ER in February.  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  He quit smoking cigarettes about 3 weeks ago.  He smokes MJ daily.   Her reports a "Thing in my head" for a month of two.  He had it in the past.  He Was given ketoconazole for tinea capitis. In ER in 2019  He has Never been told he had HTN.  BP elevated x 3 in ER this year and was elevated in ER in Nov 2019.             Relevant past medical, surgical, family and social history reviewed and updated as indicated. Interim medical history since our last visit reviewed. Allergies and medications reviewed and updated.   Current Outpatient Medications:  Marland Kitchen  VITAMIN D PO, Take 2,000 Units by mouth daily., Disp: , Rfl:      Review of Systems  Per HPI unless specifically indicated above     Objective:    BP (!) 157/88   Pulse 90   Temp (!) 97.4 F (36.3 C)   SpO2 99%   Wt Readings from Last 3 Encounters:  06/18/20 215 lb (97.5 kg)  04/08/20 185 lb (83.9 kg)  03/24/20 185 lb (83.9 kg)    Physical Exam Vitals reviewed.  Constitutional:      General: He is not in acute distress.    Appearance: He is well-developed. He is not ill-appearing.  HENT:      Head: Normocephalic and atraumatic.     Right Ear: Tympanic membrane and ear canal normal.     Left Ear: Tympanic membrane and ear canal normal.  Eyes:     Conjunctiva/sclera: Conjunctivae normal.     Pupils: Pupils are equal, round, and reactive to light.  Neck:     Thyroid: No thyromegaly.  Cardiovascular:     Rate and Rhythm: Normal rate and regular rhythm.  Pulmonary:     Effort: Pulmonary effort is normal.     Breath sounds: Normal breath sounds. No wheezing or rales.  Abdominal:     General: Bowel sounds are normal.     Palpations: Abdomen is soft. There is no mass.     Tenderness: There is no abdominal tenderness.  Musculoskeletal:     Cervical back: Neck supple.  Lymphadenopathy:     Cervical: No cervical adenopathy.  Skin:    General: Skin is warm.     Findings: No rash.     Comments: Scaly patch in scalp  Neurological:  Mental Status: He is alert and oriented to person, place, and time.  Psychiatric:        Attention and Perception: Attention normal.        Speech: Speech normal.        Behavior: Behavior normal. Behavior is cooperative.     Comments: Pleasant and engaged.         Assessment & Plan:     Encounter Diagnoses  Name Primary?  . Encounter to establish care Yes  . Primary hypertension   . Tinea capitis   . Screening cholesterol level   . Screening for diabetes mellitus   . Elevated glucose level   . Marijuana user      -Will get baseline labs -Start lisinopril for HTN.  Pt is given reading information on HTN -rx Diflucan for tinea capitis -pt educated and encouraged to get covid vaccination -pt congratulated on stopping smoking and is encouraged to stay off cigarettes -F/u 1 month to recheck bp and discuss referral to GI for blood PR.  Pt is to contact office sooner prn

## 2020-07-31 NOTE — Telephone Encounter (Signed)
Attempted call to client for follow up after [facility] clinic appointment. No answer and no voicemail set up. Will attempt again later today.   Francee Nodal RN [employer]

## 2020-07-31 NOTE — Patient Instructions (Signed)

## 2020-08-04 NOTE — Congregational Nurse Program (Signed)
Updated care coordination note to reflect [employer] approval until 06/29/21.  Francee Nodal RN [facility]

## 2020-08-05 ENCOUNTER — Other Ambulatory Visit: Payer: Self-pay

## 2020-08-05 ENCOUNTER — Other Ambulatory Visit (HOSPITAL_COMMUNITY)
Admission: RE | Admit: 2020-08-05 | Discharge: 2020-08-05 | Disposition: A | Discharge status: Home / Self Care | Payer: Self-pay | Source: Ambulatory Visit | Attending: Physician Assistant | Admitting: Physician Assistant

## 2020-08-05 DIAGNOSIS — I10 Essential (primary) hypertension: Secondary | ICD-10-CM | POA: Insufficient documentation

## 2020-08-05 DIAGNOSIS — R7309 Other abnormal glucose: Secondary | ICD-10-CM | POA: Insufficient documentation

## 2020-08-05 DIAGNOSIS — Z13220 Encounter for screening for lipoid disorders: Secondary | ICD-10-CM | POA: Insufficient documentation

## 2020-08-05 DIAGNOSIS — Z131 Encounter for screening for diabetes mellitus: Secondary | ICD-10-CM | POA: Insufficient documentation

## 2020-08-05 LAB — LIPID PANEL
Cholesterol: 241 mg/dL — ABNORMAL HIGH (ref 0–200)
HDL: 41 mg/dL (ref >40)
LDL Cholesterol: 139 mg/dL — ABNORMAL HIGH (ref 0–99)
Total CHOL/HDL Ratio: 5.9 RATIO
Triglycerides: 303 mg/dL — ABNORMAL HIGH (ref <150)
VLDL: 61 mg/dL — ABNORMAL HIGH (ref 0–40)

## 2020-08-05 LAB — COMPREHENSIVE METABOLIC PANEL
ALT: 36 U/L (ref 0–44)
AST: 22 U/L (ref 15–41)
Albumin: 4.1 g/dL (ref 3.5–5.0)
Alkaline Phosphatase: 71 U/L (ref 38–126)
Anion gap: 5 (ref 5–15)
BUN: 15 mg/dL (ref 6–20)
CO2: 27 mmol/L (ref 22–32)
Calcium: 9.5 mg/dL (ref 8.9–10.3)
Chloride: 107 mmol/L (ref 98–111)
Creatinine, Ser: 0.88 mg/dL (ref 0.61–1.24)
GFR, Estimated: >60 mL/min (ref >60)
Glucose, Bld: 117 mg/dL — ABNORMAL HIGH (ref 70–99)
Potassium: 4.6 mmol/L (ref 3.5–5.1)
Sodium: 139 mmol/L (ref 135–145)
Total Bilirubin: 0.5 mg/dL (ref 0.3–1.2)
Total Protein: 6.9 g/dL (ref 6.5–8.1)

## 2020-08-06 LAB — HEMOGLOBIN A1C
Hgb A1c MFr Bld: 5.7 % — ABNORMAL HIGH (ref 4.8–5.6)
Mean Plasma Glucose: 117 mg/dL

## 2020-08-27 ENCOUNTER — Encounter: Payer: Self-pay | Admitting: Physician Assistant

## 2020-08-27 ENCOUNTER — Other Ambulatory Visit: Payer: Self-pay

## 2020-08-27 ENCOUNTER — Ambulatory Visit: Payer: Self-pay | Admitting: Physician Assistant

## 2020-08-27 VITALS — BP 159/88 | HR 103 | Temp 97.0°F | Wt 220.0 lb

## 2020-08-27 DIAGNOSIS — E785 Hyperlipidemia, unspecified: Secondary | ICD-10-CM

## 2020-08-27 DIAGNOSIS — I10 Essential (primary) hypertension: Secondary | ICD-10-CM

## 2020-08-27 MED ORDER — LISINOPRIL-HYDROCHLOROTHIAZIDE 20-12.5 MG PO TABS: 1.0000 | ORAL_TABLET | Freq: Every day | ORAL | 1 refills | Status: DC

## 2020-08-27 NOTE — Progress Notes (Signed)
BP (!) 159/88   Pulse (!) 103   Temp (!) 97 F (36.1 C)   Wt 220 lb (99.8 kg)   SpO2 99%   BMI 30.68 kg/m    Subjective:    Patient ID: Walter Blackwell, male    DOB: [birth year], 35 y.o.   MRN: [id number]  HPI: Walter Blackwell is a 35 y.o. male presenting on 08/27/2020 for Hypertension   HPI  Pt had a negative covid 19 screening questionnaire.   Chief Complaint  Patient presents with   Hypertension   Pt is doing well.  He says scalp improved but not completely gone.  He has no problems with his lisinopril.   Relevant past medical, surgical, family and social history reviewed and updated as indicated. Interim medical history since our last visit reviewed. Allergies and medications reviewed and updated.   Current Outpatient Medications:    lisinopril (ZESTRIL) 10 MG tablet, Take 1 tablet (10 mg total) by mouth daily., Disp: 30 tablet, Rfl: 1   VITAMIN D PO, Take 2,000 Units by mouth daily., Disp: , Rfl:     Review of Systems  Per HPI unless specifically indicated above     Objective:    BP (!) 159/88   Pulse (!) 103   Temp (!) 97 F (36.1 C)   Wt 220 lb (99.8 kg)   SpO2 99%   BMI 30.68 kg/m   Wt Readings from Last 3 Encounters:  08/27/20 220 lb (99.8 kg)  06/18/20 215 lb (97.5 kg)  04/08/20 185 lb (83.9 kg)    Physical Exam Vitals reviewed.  Constitutional:      General: He is not in acute distress.    Appearance: He is well-developed. He is not ill-appearing.  HENT:     Head: Normocephalic and atraumatic.  Cardiovascular:     Rate and Rhythm: Normal rate and regular rhythm.  Pulmonary:     Effort: Pulmonary effort is normal.     Breath sounds: Normal breath sounds. No wheezing.  Abdominal:     General: Bowel sounds are normal.     Palpations: Abdomen is soft.     Tenderness: There is no abdominal tenderness.  Musculoskeletal:     Cervical back: Neck supple.     Right lower leg: No edema.     Left lower leg: No edema.   Lymphadenopathy:     Cervical: No cervical adenopathy.  Skin:    General: Skin is warm and dry.  Neurological:     Mental Status: He is alert and oriented to person, place, and time.  Psychiatric:        Behavior: Behavior normal.    Results for orders placed or performed during the hospital encounter of 08/05/20  Hemoglobin A1c  Result Value Ref Range   Hgb A1c MFr Bld 5.7 (H) 4.8 - 5.6 %   Mean Plasma Glucose 117 mg/dL  Lipid panel  Result Value Ref Range   Cholesterol 241 (H) 0 - 200 mg/dL   Triglycerides 229 (H) <150 mg/dL   HDL 41 >79 mg/dL   Total CHOL/HDL Ratio 5.9 RATIO   VLDL 61 (H) 0 - 40 mg/dL   LDL Cholesterol 892 (H) 0 - 99 mg/dL  Comprehensive metabolic panel  Result Value Ref Range   Sodium 139 135 - 145 mmol/L   Potassium 4.6 3.5 - 5.1 mmol/L   Chloride 107 98 - 111 mmol/L   CO2 27 22 - 32 mmol/L   Glucose, Bld 117 (H)  70 - 99 mg/dL   BUN 15 6 - 20 mg/dL   Creatinine, Ser 7.91 0.61 - 1.24 mg/dL   Calcium 9.5 8.9 - 50.5 mg/dL   Total Protein 6.9 6.5 - 8.1 g/dL   Albumin 4.1 3.5 - 5.0 g/dL   AST 22 15 - 41 U/L   ALT 36 0 - 44 U/L   Alkaline Phosphatase 71 38 - 126 U/L   Total Bilirubin 0.5 0.3 - 1.2 mg/dL   GFR, Estimated >69 >79 mL/min   Anion gap 5 5 - 15      Assessment & Plan:    Encounter Diagnoses  Name Primary?   Primary hypertension Yes   Hyperlipidemia, unspecified hyperlipidemia type      -reviewed labs with pt  -pt counseled on high cholesterol and dietary changes that could improve his cholesterol.  He is given reading information.  Will plan to recheck lipids about 3 months -change to Lisinopril-hctz as BP still inadequately controlled -pt to follow up 4 wk.  He is to contact office sooner prn

## 2020-09-22 ENCOUNTER — Ambulatory Visit: Payer: Self-pay | Admitting: Physician Assistant

## 2020-09-22 ENCOUNTER — Encounter: Payer: Self-pay | Admitting: Physician Assistant

## 2020-09-22 VITALS — BP 140/69 | HR 95 | Temp 97.5°F | Wt 223.0 lb

## 2020-09-22 DIAGNOSIS — I10 Essential (primary) hypertension: Secondary | ICD-10-CM

## 2020-09-22 MED ORDER — LISINOPRIL-HYDROCHLOROTHIAZIDE 20-25 MG PO TABS: 1.0000 | ORAL_TABLET | Freq: Every day | ORAL | 3 refills | Status: DC

## 2020-09-22 NOTE — Progress Notes (Signed)
   BP 140/69   Pulse 95   Temp (!) 97.5 F (36.4 C)   Wt 223 lb (101.2 kg)   SpO2 97%   BMI 31.10 kg/m    Subjective:    Patient ID: Walter Blackwell, male    DOB: 07/24/1985, 35 y.o.   MRN: [id number]  HPI: Walter Blackwell is a 35 y.o. male presenting on 09/22/2020 for Hypertension   HPI   Pt had a negative covid 19 screening questionnaire.    Chief Complaint  Patient presents with   Hypertension    Pt says he has been trying to watch what he eats and limit his salt and fat intake.  He has no complaints today.   He has not gotten the covid vaccination.   Relevant past medical, surgical, family and social history reviewed and updated as indicated. Interim medical history since our last visit reviewed. Allergies and medications reviewed and updated.   Current Outpatient Medications:    lisinopril-hydrochlorothiazide (ZESTORETIC) 20-12.5 MG tablet, Take 1 tablet by mouth daily., Disp: 30 tablet, Rfl: 1   VITAMIN D PO, Take 2,000 Units by mouth daily., Disp: , Rfl:    Review of Systems  Per HPI unless specifically indicated above     Objective:    BP 140/69   Pulse 95   Temp (!) 97.5 F (36.4 C)   Wt 223 lb (101.2 kg)   SpO2 97%   BMI 31.10 kg/m   Wt Readings from Last 3 Encounters:  09/22/20 223 lb (101.2 kg)  08/27/20 220 lb (99.8 kg)  06/18/20 215 lb (97.5 kg)    Physical Exam Vitals reviewed.  Constitutional:      General: He is not in acute distress.    Appearance: He is well-developed. He is not ill-appearing.  HENT:     Head: Normocephalic and atraumatic.  Cardiovascular:     Rate and Rhythm: Normal rate and regular rhythm.  Pulmonary:     Effort: Pulmonary effort is normal.     Breath sounds: Normal breath sounds. No wheezing.  Abdominal:     General: Bowel sounds are normal.     Palpations: Abdomen is soft.     Tenderness: There is no abdominal tenderness.  Musculoskeletal:     Cervical back: Neck supple.     Right lower leg: No  edema.     Left lower leg: No edema.  Lymphadenopathy:     Cervical: No cervical adenopathy.  Skin:    General: Skin is warm and dry.  Neurological:     Mental Status: He is alert and oriented to person, place, and time.  Psychiatric:        Behavior: Behavior normal.           Assessment & Plan:    Encounter Diagnosis  Name Primary?   Primary hypertension Yes    -Increase to lisinopril 20/hctz 25 -Pt educated and encouraged to get covid vaccination -pt to follow up 1 month to recheck bp.  Will check bmp prior to appt.  He is to contact office sooner prn

## 2020-09-30 ENCOUNTER — Other Ambulatory Visit: Payer: Self-pay | Admitting: Physician Assistant

## 2020-09-30 DIAGNOSIS — I10 Essential (primary) hypertension: Secondary | ICD-10-CM

## 2020-10-05 ENCOUNTER — Encounter (HOSPITAL_COMMUNITY): Payer: Self-pay

## 2020-10-05 ENCOUNTER — Other Ambulatory Visit: Payer: Self-pay

## 2020-10-05 ENCOUNTER — Emergency Department (HOSPITAL_COMMUNITY)
Admission: EM | Admit: 2020-10-05 | Discharge: 2020-10-05 | Disposition: A | Discharge status: Home / Self Care | Payer: Self-pay | Attending: Emergency Medicine | Admitting: Emergency Medicine

## 2020-10-05 DIAGNOSIS — K644 Residual hemorrhoidal skin tags: Secondary | ICD-10-CM | POA: Insufficient documentation

## 2020-10-05 DIAGNOSIS — Z87891 Personal history of nicotine dependence: Secondary | ICD-10-CM | POA: Insufficient documentation

## 2020-10-05 LAB — POC OCCULT BLOOD, ED: Fecal Occult Bld: NEGATIVE

## 2020-10-05 MED ORDER — HYDROCORTISONE (PERIANAL) 2.5 % EX CREA: 1.0000 "application " | TOPICAL_CREAM | Freq: Two times a day (BID) | CUTANEOUS | 0 refills | Status: AC

## 2020-10-05 NOTE — Discharge Instructions (Addendum)
You have two hemorrhoids that are likely the source of your pain and bleeding.  Try doing sitz baths 2-3 times a day.  Also, use the prescription cream as directed and take a stool softer or eat plenty of fruits and drink water to help keep your stools soft.  Avoid straining to have a bowel movement.  You may follow-up with the surgeon listed if your symptoms are not improving after 1 week.  Return to the emergency department if you develop any new or worsening symptoms.

## 2020-10-05 NOTE — ED Triage Notes (Signed)
Pt presents to ED with complaints of rectal pain and bleeding after having BM. Pt states he had used OTC hemorrhoid cream with minimal relief.

## 2020-10-05 NOTE — ED Provider Notes (Signed)
[facility] EMERGENCY DEPARTMENT Provider Note   CSN: [id number] Arrival date & time: 10/05/20  5885     History Chief Complaint  Patient presents with   Rectal Pain    Walter Blackwell is a 35 y.o. male.  HPI     Walter Blackwell is a 35 y.o. male who presents to the Emergency Department complaining of rectal pain and intermittent episodes bright red blood per rectum x4 to 5 days.  He states that he has recently began eating plums and had increased in number of bowel movements.  He has noticed few episodes of small amounts of bright red blood in the toilet and on the tissue after defecating.  This is only present after defecating. He has been using over-the-counter hemorrhoid cream with some relief.  He states he has been having pain with straining to defecate for several days, but had bowel movement this morning that was no longer painful.  He denies having any dizziness, syncope, abdominal pain, weakness, vomiting, fever, chills, back pain or dysuria.    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  Social History   Tobacco Use   Smoking status: Former    Packs/day: 0.50    Types: Cigarettes    Quit date: 07/10/2020    Years since quitting: 0.2   Smokeless tobacco: Never  Vaping Use   Vaping Use: Never used  Substance Use Topics   Alcohol use: Yes    Comment: socially    Drug use: Yes    Types: Marijuana    Comment: daily    Home Medications Prior to Admission medications   Medication Sig Start Date End Date Taking? Authorizing Provider   lisinopril-hydrochlorothiazide (ZESTORETIC) 20-25 MG tablet Take 1 tablet by mouth daily. 09/22/20   Jacquelin Hawking, PA-C  VITAMIN D PO Take 2,000 Units by mouth daily.    [provider]    Allergies    Patient has no known allergies.  Review of Systems   Review of Systems  Constitutional:  Negative for chills, fatigue and fever.  Respiratory:  Negative for shortness of breath.   Cardiovascular:  Negative for chest pain.  Gastrointestinal:  Positive for blood in stool. Negative for abdominal pain, nausea and vomiting.       Rectal pain  Genitourinary:  Negative for dysuria, flank pain and hematuria.  Musculoskeletal:  Negative for arthralgias, back pain and myalgias.  Skin:  Negative for color change and rash.  Neurological:  Negative for dizziness, syncope, weakness, numbness and headaches.  Hematological:  Does not bruise/bleed easily.   Physical Exam Updated Vital Signs BP (!) 144/84 (BP Location: Right Arm)   Pulse (!) 110   Temp 98.4 F (36.9 C) (Oral)   Resp 18   Ht 5\' 10"  (1.778 m)   Wt 101.6 kg   SpO2 100%   BMI 32.14 kg/m   Physical Exam Vitals and nursing note reviewed. Exam conducted with a chaperone present.  Constitutional:      General: He is not in acute  distress.    Appearance: Normal appearance. He is not ill-appearing or toxic-appearing.  Cardiovascular:     Rate and Rhythm: Normal rate and regular rhythm.     Pulses: Normal pulses.  Pulmonary:     Effort: Pulmonary effort is normal.     Breath sounds: Normal breath sounds.  Abdominal:     Palpations: Abdomen is soft.     Tenderness: There is no abdominal tenderness.  Genitourinary:    Rectum: Guaiac result positive. Tenderness and external hemorrhoid present. No mass. Normal anal tone.     Comments: Digital rectal exam performed by me, brown stool, heme positive, patient tender on exam.  2 small external nonthrombosed hemorrhoids present.  No other palpable rectal  masses. Musculoskeletal:        General: Normal range of motion.  Skin:    General: Skin is warm.     Capillary Refill: Capillary refill takes less than 2 seconds.     Findings: No rash.  Neurological:     General: No focal deficit present.     Mental Status: He is alert.     Sensory: No sensory deficit.     Motor: No weakness.    ED Results / Procedures / Treatments   Labs (all labs ordered are listed, but only abnormal results are displayed) Labs Reviewed - No data to display  EKG None  Radiology No results found.  Procedures Procedures   Medications Ordered in ED Medications - No data to display  ED Course  I have reviewed the triage vital signs and the nursing notes.  Pertinent labs & imaging results that were available during my care of the patient were reviewed by me and considered in my medical decision making (see chart for details).    MDM Rules/Calculators/A&P                           Patient here for evaluation of rectal pain and intermittent, bright red blood per rectum for several days.  Endorses increased defecation due to eating plums.  Using over-the-counter hemorrhoid cream without relief.  On exam, patient has 2 nonthrombosed external hemorrhoids present.  Normal rectal tone on DRE.  Abdomen is soft and nontender.  Pt well appearing.  Vitals reassuring.  Doubt acute abdominal process.  Brown heme positive stool.  Patient has been encouraged to try sitz bath's, continue to keep stools soft.  We will try An[facility] prescription for symptoms.  He is agreeable to plan.  All questions answered.  Return precautions were discussed.  He will follow-up with PCP also given referral information for general surgery if needed.   Final Clinical Impression(s) / ED Diagnoses Final diagnoses:  External hemorrhoid, bleeding    Rx / DC Orders ED Discharge Orders     None        Pauline Aus, PA-C 10/05/20 0954    Eber Hong, MD 10/05/20 1135

## 2020-10-20 ENCOUNTER — Other Ambulatory Visit: Payer: Self-pay

## 2020-10-20 ENCOUNTER — Other Ambulatory Visit (HOSPITAL_COMMUNITY)
Admission: RE | Admit: 2020-10-20 | Discharge: 2020-10-20 | Disposition: A | Discharge status: Home / Self Care | Payer: Self-pay | Source: Ambulatory Visit | Attending: Physician Assistant | Admitting: Physician Assistant

## 2020-10-20 DIAGNOSIS — I10 Essential (primary) hypertension: Secondary | ICD-10-CM | POA: Insufficient documentation

## 2020-10-20 LAB — BASIC METABOLIC PANEL
Anion gap: 8 (ref 5–15)
BUN: 25 mg/dL — ABNORMAL HIGH (ref 6–20)
CO2: 23 mmol/L (ref 22–32)
Calcium: 9.8 mg/dL (ref 8.9–10.3)
Chloride: 104 mmol/L (ref 98–111)
Creatinine, Ser: 1.17 mg/dL (ref 0.61–1.24)
GFR, Estimated: >60 mL/min (ref >60)
Glucose, Bld: 113 mg/dL — ABNORMAL HIGH (ref 70–99)
Potassium: 4.6 mmol/L (ref 3.5–5.1)
Sodium: 135 mmol/L (ref 135–145)

## 2020-10-23 ENCOUNTER — Ambulatory Visit: Payer: Self-pay | Admitting: Physician Assistant

## 2020-10-23 ENCOUNTER — Other Ambulatory Visit: Payer: Self-pay

## 2020-10-23 ENCOUNTER — Encounter: Payer: Self-pay | Admitting: Physician Assistant

## 2020-10-23 VITALS — BP 126/70 | HR 100 | Temp 97.6°F | Wt 217.0 lb

## 2020-10-23 DIAGNOSIS — I10 Essential (primary) hypertension: Secondary | ICD-10-CM

## 2020-10-23 DIAGNOSIS — L219 Seborrheic dermatitis, unspecified: Secondary | ICD-10-CM

## 2020-10-23 NOTE — Progress Notes (Signed)
BP 126/70   Pulse 100   Temp 97.6 F (36.4 C)   Wt 217 lb (98.4 kg)   SpO2 99%   BMI 31.14 kg/m    Subjective:    Patient ID: Walter Blackwell, male    DOB: [birth year], 35 y.o.   MRN: [id number]  HPI: CARMEL WADDINGTON is a 35 y.o. male presenting on 10/23/2020 for Hypertension   HPI    Pt had a negative covid 19 screening questionnaire.    Chief Complaint  Patient presents with   Hypertension     Pt Works doing yard work.  He says he is well and he has no complaints..    Relevant past medical, surgical, family and social history reviewed and updated as indicated. Interim medical history since our last visit reviewed. Allergies and medications reviewed and updated.   Current Outpatient Medications:    hydrocortisone (ANUSOL-HC) 2.5 % rectal cream, Place 1 application rectally 2 (two) times daily., Disp: 30 g, Rfl: 0   lisinopril-hydrochlorothiazide (ZESTORETIC) 20-25 MG tablet, Take 1 tablet by mouth daily., Disp: 30 tablet, Rfl: 3   VITAMIN D PO, Take 2,000 Units by mouth daily., Disp: , Rfl:     Review of Systems  Per HPI unless specifically indicated above     Objective:    BP 126/70   Pulse 100   Temp 97.6 F (36.4 C)   Wt 217 lb (98.4 kg)   SpO2 99%   BMI 31.14 kg/m   Wt Readings from Last 3 Encounters:  10/23/20 217 lb (98.4 kg)  10/05/20 224 lb (101.6 kg)  09/22/20 223 lb (101.2 kg)    Physical Exam Vitals reviewed.  Constitutional:      General: He is not in acute distress.    Appearance: He is well-developed. He is not ill-appearing.  HENT:     Head: Normocephalic and atraumatic.  Cardiovascular:     Rate and Rhythm: Normal rate and regular rhythm.  Pulmonary:     Effort: Pulmonary effort is normal.     Breath sounds: Normal breath sounds. No wheezing.  Abdominal:     General: Bowel sounds are normal.     Palpations: Abdomen is soft.     Tenderness: There is no abdominal tenderness.  Musculoskeletal:     Cervical back:  Neck supple.     Right lower leg: No edema.     Left lower leg: No edema.  Lymphadenopathy:     Cervical: No cervical adenopathy.  Skin:    General: Skin is warm and dry.  Neurological:     Mental Status: He is alert and oriented to person, place, and time.  Psychiatric:        Behavior: Behavior normal.       Results for orders placed or performed during the hospital encounter of 10/20/20  Basic metabolic panel  Result Value Ref Range   Sodium 135 135 - 145 mmol/L   Potassium 4.6 3.5 - 5.1 mmol/L   Chloride 104 98 - 111 mmol/L   CO2 23 22 - 32 mmol/L   Glucose, Bld 113 (H) 70 - 99 mg/dL   BUN 25 (H) 6 - 20 mg/dL   Creatinine, Ser 7.86 0.61 - 1.24 mg/dL   Calcium 9.8 8.9 - 76.7 mg/dL   GFR, Estimated >20 >94 mL/min   Anion gap 8 5 - 15      Assessment & Plan:    Encounter Diagnoses  Name Primary?   Primary hypertension Yes  Seborrheic dermatitis      -Reviewed labs with pt -pt to Continue current meds -will Monitor renal function -pt was Counseled on seborrheic dermatitis -pt was encouraged to get covid vaccination -pt to follow up 3 months.   He is to contact office sooner prn

## 2020-10-23 NOTE — Patient Instructions (Signed)
Seborrheic Dermatitis, Adult Seborrheic dermatitis is a skin disease that causes red, scaly patches. It usually occurs on the scalp, and it is often called dandruff. The patches may appear on other parts of the body. Skin patches tend to appear where there are many oil glands in the skin. Areas of the body that are commonly affected include the: Scalp. Ears. Eyebrows. Face. Bearded area of men's faces. Skin folds of the body, such as the armpits, groin, and buttocks. Chest. The condition may come and go for no known reason, and it is often long-lasting (chronic). What are the causes? The cause of this condition is not known. What increases the risk? The following factors may make you more likely to develop this condition: Having certain conditions, such as: HIV (human immunodeficiency virus). AIDS (acquired immunodeficiency syndrome). Parkinson's disease. Mood disorders, such as depression. Being 40-60 years old. What are the signs or symptoms? Symptoms of this condition include: Thick scales on the scalp. Redness on the face or in the armpits. Skin that is flaky. The flakes may be white or yellow. Skin that seems oily or dry but is not helped with moisturizers. Itching or burning in the affected areas. How is this diagnosed? This condition is diagnosed with a medical history and physical exam. A sample of your skin may be tested (skin biopsy). You may need to see a skin specialist (dermatologist). How is this treated? There is no cure for this condition, but treatment can help to manage the symptoms. You may get treatment to remove scales, lower the risk of skin infection, and reduce swelling or itching. Treatment may include: Creams that reduce skin yeast. Medicated shampoo. Moisturizing creams or ointments. Creams that reduce swelling and irritation (steroids). Follow these instructions at home: Apply over-the-counter and prescription medicines only as told by your health care  provider. Use any medicated shampoo, skin creams, or ointments only as told by your health care provider. Keep all follow-up visits as told by your health care provider. This is important. Contact a health care provider if: Your symptoms do not improve with treatment. Your symptoms get worse. You have new symptoms. Get help right away if: Your condition rapidly worsens with treatment. Summary Seborrheic dermatitis is a skin disease that causes red, scaly patches. Seborrheic dermatitis commonly affects the scalp, face, and skin folds. There is no cure for this condition, but treatment can help to manage the symptoms. This information is not intended to replace advice given to you by your health care provider. Make sure you discuss any questions you have with your health care provider. Document Revised: 11/23/2018 Document Reviewed: 11/23/2018 Elsevier Patient Education  2022 Elsevier Inc.  

## 2020-12-25 ENCOUNTER — Other Ambulatory Visit: Payer: Self-pay | Admitting: Physician Assistant

## 2020-12-25 MED ORDER — LISINOPRIL-HYDROCHLOROTHIAZIDE 20-25 MG PO TABS: 1.0000 | ORAL_TABLET | Freq: Every day | ORAL | 3 refills | Status: AC

## 2021-01-07 ENCOUNTER — Other Ambulatory Visit: Payer: Self-pay | Admitting: Physician Assistant

## 2021-01-07 DIAGNOSIS — R7989 Other specified abnormal findings of blood chemistry: Secondary | ICD-10-CM

## 2021-01-07 DIAGNOSIS — E785 Hyperlipidemia, unspecified: Secondary | ICD-10-CM

## 2021-01-07 DIAGNOSIS — I10 Essential (primary) hypertension: Secondary | ICD-10-CM

## 2021-01-27 ENCOUNTER — Encounter: Payer: Self-pay | Admitting: Physician Assistant

## 2021-01-27 ENCOUNTER — Ambulatory Visit: Payer: Self-pay | Admitting: Physician Assistant

## 2021-01-27 DIAGNOSIS — I10 Essential (primary) hypertension: Secondary | ICD-10-CM

## 2021-01-27 DIAGNOSIS — E785 Hyperlipidemia, unspecified: Secondary | ICD-10-CM | POA: Insufficient documentation

## 2021-01-27 HISTORY — DX: Essential (primary) hypertension: I10

## 2021-01-27 MED ORDER — PANTOPRAZOLE SODIUM 40 MG PO TBEC: 40.0000 mg | DELAYED_RELEASE_TABLET | Freq: Every day | ORAL | 3 refills | Status: AC

## 2021-01-27 NOTE — Progress Notes (Signed)
There were no vitals taken for this visit.   Subjective:    Patient ID: Walter Blackwell, male    DOB: Sep 13, 1985, 35 y.o.   MRN: [id number]  HPI: Walter Blackwell is a 35 y.o. male presenting on 01/27/2021 for No chief complaint on file.   HPI   This is a telemedicine appointment through [employer].  I connected with  Newt Minion on 01/27/21 by a video enabled telemedicine application and verified that I am speaking with the correct person using two identifiers.   I discussed the limitations of evaluation and management by telemedicine. The patient expressed understanding and agreed to proceed.  Pt is in his parked car at his work.  Provider is at office.   Pt is 35yoM with appointment to follow up HTN and dyslipidemia.   Pt was seen in ER at [facility] on 11/3 and bp was 129/79.  He says the Meds given in ER helped but he ran out; he was given protonix.  Pt says he started a new job and will be getting insurance after 60 days.    Pt had Cr elevation when seen in ER - 01/01/21 Cr- 1.76.  pt has not yet gotten labs drawn for today's appointment.    Relevant past medical, surgical, family and social history reviewed and updated as indicated. Interim medical history since our last visit reviewed. Allergies and medications reviewed and updated.   Current Outpatient Medications:    lisinopril-hydrochlorothiazide (ZESTORETIC) 20-25 MG tablet, Take 1 tablet by mouth daily., Disp: 30 tablet, Rfl: 3   hydrocortisone (ANUSOL-HC) 2.5 % rectal cream, Place 1 application rectally 2 (two) times daily., Disp: 30 g, Rfl: 0   VITAMIN D PO, Take 2,000 Units by mouth daily., Disp: , Rfl:      Review of Systems  Per HPI unless specifically indicated above     Objective:    There were no vitals taken for this visit.  Wt Readings from Last 3 Encounters:  10/23/20 217 lb (98.4 kg)  10/05/20 224 lb (101.6 kg)  09/22/20 223 lb (101.2 kg)    Physical Exam Constitutional:       General: He is not in acute distress.    Appearance: He is not ill-appearing or toxic-appearing.  HENT:     Head: Normocephalic and atraumatic.  Pulmonary:     Effort: No respiratory distress.     Comments: Pt is talking in complete sentences without dyspnea Neurological:     Mental Status: He is alert and oriented to person, place, and time.  Psychiatric:        Attention and Perception: Attention normal.        Speech: Speech normal.        Behavior: Behavior normal. Behavior is cooperative.          Assessment & Plan:   Encounter Diagnoses  Name Primary?   Primary hypertension Yes   Hyperlipidemia, unspecified hyperlipidemia type      -discussed recent creatinine elevation with pt.  Likely due to dehydration at the time.  He is encouraged to get his labs drawn.  He will be called with results -rx Protonix to [facility]- [place].  Pt is encouraged to notify office and come in for appointment if his stomach symptoms are not controlled with the protonix.  He is encouraged to avoid spicy foods and greasy foods -pt to continue current bp medication -discussed with pt that he will no longer be eligible to [facility] once  his [facility] starts.  Offered to schedule appointment for 3 months that he could cancel but he declined.  He is encouraged to contact office for follow up if things fall through with his insurance.  He is encouraged to get established with new pcp once his insurance starts.  He is to contact office for any needs prior to the insurance starting.  He states understanding.

## 2021-01-28 ENCOUNTER — Other Ambulatory Visit (HOSPITAL_COMMUNITY)
Admission: RE | Admit: 2021-01-28 | Discharge: 2021-01-28 | Disposition: A | Discharge status: Home / Self Care | Payer: Self-pay | Source: Ambulatory Visit | Attending: Physician Assistant | Admitting: Physician Assistant

## 2021-01-28 DIAGNOSIS — E785 Hyperlipidemia, unspecified: Secondary | ICD-10-CM | POA: Insufficient documentation

## 2021-01-28 DIAGNOSIS — R7989 Other specified abnormal findings of blood chemistry: Secondary | ICD-10-CM | POA: Insufficient documentation

## 2021-01-28 DIAGNOSIS — I10 Essential (primary) hypertension: Secondary | ICD-10-CM | POA: Insufficient documentation

## 2021-01-28 LAB — COMPREHENSIVE METABOLIC PANEL
ALT: 22 U/L (ref 0–44)
AST: 19 U/L (ref 15–41)
Albumin: 4.6 g/dL (ref 3.5–5.0)
Alkaline Phosphatase: 90 U/L (ref 38–126)
Anion gap: 10 (ref 5–15)
BUN: 20 mg/dL (ref 6–20)
CO2: 24 mmol/L (ref 22–32)
Calcium: 9.7 mg/dL (ref 8.9–10.3)
Chloride: 101 mmol/L (ref 98–111)
Creatinine, Ser: 1.13 mg/dL (ref 0.61–1.24)
GFR, Estimated: >60 mL/min (ref >60)
Glucose, Bld: 114 mg/dL — ABNORMAL HIGH (ref 70–99)
Potassium: 4 mmol/L (ref 3.5–5.1)
Sodium: 135 mmol/L (ref 135–145)
Total Bilirubin: 0.4 mg/dL (ref 0.3–1.2)
Total Protein: 7.8 g/dL (ref 6.5–8.1)

## 2021-01-28 LAB — LIPID PANEL
Cholesterol: 194 mg/dL (ref 0–200)
HDL: 32 mg/dL — ABNORMAL LOW (ref >40)
LDL Cholesterol: 144 mg/dL — ABNORMAL HIGH (ref 0–99)
Total CHOL/HDL Ratio: 6.1 RATIO
Triglycerides: 91 mg/dL (ref <150)
VLDL: 18 mg/dL (ref 0–40)

## 2021-07-15 ENCOUNTER — Encounter (HOSPITAL_COMMUNITY): Payer: Self-pay | Admitting: Emergency Medicine

## 2021-07-15 ENCOUNTER — Other Ambulatory Visit: Payer: Self-pay

## 2021-07-15 ENCOUNTER — Emergency Department (HOSPITAL_COMMUNITY)
Admission: EM | Admit: 2021-07-15 | Discharge: 2021-07-15 | Disposition: A | Discharge status: Home / Self Care | Payer: BC Managed Care – PPO | Attending: Emergency Medicine | Admitting: Emergency Medicine

## 2021-07-15 DIAGNOSIS — Z87891 Personal history of nicotine dependence: Secondary | ICD-10-CM | POA: Insufficient documentation

## 2021-07-15 DIAGNOSIS — K469 Unspecified abdominal hernia without obstruction or gangrene: Secondary | ICD-10-CM | POA: Insufficient documentation

## 2021-07-15 DIAGNOSIS — I10 Essential (primary) hypertension: Secondary | ICD-10-CM | POA: Diagnosis not present

## 2021-07-15 DIAGNOSIS — R1010 Upper abdominal pain, unspecified: Secondary | ICD-10-CM | POA: Diagnosis present

## 2021-07-15 DIAGNOSIS — K439 Ventral hernia without obstruction or gangrene: Secondary | ICD-10-CM

## 2021-07-15 LAB — CBC WITH DIFFERENTIAL/PLATELET
Abs Immature Granulocytes: 0.02 10*3/uL (ref 0.00–0.07)
Basophils Absolute: 0 10*3/uL (ref 0.0–0.1)
Basophils Relative: 0 %
Eosinophils Absolute: 0 10*3/uL (ref 0.0–0.5)
Eosinophils Relative: 0 %
HCT: 45.2 % (ref 39.0–52.0)
Hemoglobin: 14.1 g/dL (ref 13.0–17.0)
Immature Granulocytes: 0 %
Lymphocytes Relative: 20 %
Lymphs Abs: 1.7 10*3/uL (ref 0.7–4.0)
MCH: 27 pg (ref 26.0–34.0)
MCHC: 31.2 g/dL (ref 30.0–36.0)
MCV: 86.4 fL (ref 80.0–100.0)
Monocytes Absolute: 0.4 10*3/uL (ref 0.1–1.0)
Monocytes Relative: 5 %
Neutro Abs: 6.4 10*3/uL (ref 1.7–7.7)
Neutrophils Relative %: 75 %
Platelets: 199 10*3/uL (ref 150–400)
RBC: 5.23 MIL/uL (ref 4.22–5.81)
RDW: 13.8 % (ref 11.5–15.5)
WBC: 8.5 10*3/uL (ref 4.0–10.5)
nRBC: 0 % (ref 0.0–0.2)

## 2021-07-15 LAB — COMPREHENSIVE METABOLIC PANEL
ALT: 25 U/L (ref 0–44)
AST: 27 U/L (ref 15–41)
Albumin: 4.2 g/dL (ref 3.5–5.0)
Alkaline Phosphatase: 87 U/L (ref 38–126)
Anion gap: 8 (ref 5–15)
BUN: 9 mg/dL (ref 6–20)
CO2: 26 mmol/L (ref 22–32)
Calcium: 9.5 mg/dL (ref 8.9–10.3)
Chloride: 108 mmol/L (ref 98–111)
Creatinine, Ser: 1 mg/dL (ref 0.61–1.24)
GFR, Estimated: >60 mL/min (ref >60)
Glucose, Bld: 88 mg/dL (ref 70–99)
Potassium: 4 mmol/L (ref 3.5–5.1)
Sodium: 142 mmol/L (ref 135–145)
Total Bilirubin: 0.8 mg/dL (ref 0.3–1.2)
Total Protein: 7 g/dL (ref 6.5–8.1)

## 2021-07-15 LAB — URINALYSIS, ROUTINE W REFLEX MICROSCOPIC
Bacteria, UA: NONE SEEN
Bilirubin Urine: NEGATIVE
Glucose, UA: NEGATIVE mg/dL
Hgb urine dipstick: NEGATIVE
Ketones, ur: NEGATIVE mg/dL
Nitrite: NEGATIVE
Protein, ur: NEGATIVE mg/dL
Specific Gravity, Urine: 1.02 (ref 1.005–1.030)
pH: 5 (ref 5.0–8.0)

## 2021-07-15 LAB — LIPASE, BLOOD: Lipase: 21 U/L (ref 11–51)

## 2021-07-15 NOTE — Discharge Instructions (Signed)
The "lump" that you are feeling in the front of your abdomen is from the hernia.  This is a small hole on your abdominal wall up where some of your abdominal contents can sometimes poke through.  Overall this is benign, however if you develop severe pain and do not feel that you can push the contents back through the hole, please return to the ED for evaluation.  If it continues to bother you, you can follow-up with a surgeon who can surgically repair this. ?

## 2021-07-15 NOTE — ED Triage Notes (Signed)
Patient c/o intermittent mid upper abdominal pain "for a while." Patient states he was seen last year at [facility] for same complaint and had [place] with no findings. Denies any N/V/D. Feels like he has a knot in his abdomen. Patient adds he ran out of his BP medicine about 3 months ago.  ?

## 2021-07-15 NOTE — ED Provider Triage Note (Signed)
[facility] Provider Triage Evaluation Note  Walter Blackwell , a 36 y.o. male  was evaluated in triage.  Pt complains of epigastric abdominal pain that has been present for about 4 months.  When asked what brought him in today, he states "I just need to know what is going on".  He describes the pain as sometimes pressure sometimes sharp.  Denies nausea, vomiting, diarrhea, urinary symptoms, chest pain, shortness of breath. Review of Systems  Positive:  Negative:   Physical Exam  BP (!) 139/95 (BP Location: Right Arm)   Pulse 91   Temp 98.9 F (37.2 C) (Oral)   Resp 18   Ht 5\' 10"  (1.778 m)   Wt 97.5 kg   SpO2 99%   BMI 30.85 kg/m  Gen:   Awake, no distress   Resp:  Normal effort  MSK:   Moves extremities without difficulty  Other:  Abdomen not obviously tender to palpation, negative CVA tenderness, negative Murphy sign  Medical Decision Making  Medically screening exam initiated at 11:24 AM.  Appropriate orders placed.  JAFET WISSING was informed that the remainder of the evaluation will be completed by another provider, this initial triage assessment does not replace that evaluation, and the importance of remaining in the ED until their evaluation is complete.     Newt Minion, Janell Quiet 07/15/21 1127

## 2021-07-16 NOTE — ED Provider Notes (Signed)
[name] Provider Note   CSN: [id number] Arrival date & time: 07/15/21  1046     History  Chief Complaint  Patient presents with   Abdominal Pain    Walter Blackwell is a 36 y.o. male who presents to the ED for evaluation of upper abdominal discomfort that has been going on for at least 6 months.  Patient denies significant pain, though he states when he stands up and flexes his abdomen, he feels that there is a lump just above his bellybutton.  He denies nausea, vomiting, diarrhea.  Patient has been seen numerous times over the last year or so for similar complaints without any findings.    Abdominal Pain     Home Medications Prior to Admission medications   Medication Sig Start Date End Date Taking? Authorizing Provider  hydrocortisone (ANUSOL-HC) 2.5 % rectal cream Place 1 application rectally 2 (two) times daily. Patient not taking: Reported on 07/15/2021 10/05/20   Triplett, Lynelle Smoke, PA-C  lisinopril-hydrochlorothiazide (ZESTORETIC) 20-25 MG tablet Take 1 tablet by mouth daily. Patient not taking: Reported on 07/15/2021 12/25/20   Soyla Dryer, PA-C  pantoprazole (PROTONIX) 40 MG tablet Take 1 tablet (40 mg total) by mouth daily. Patient not taking: Reported on 07/15/2021 01/27/21   Soyla Dryer, PA-C  VITAMIN D PO Take 2,000 Units by mouth daily. Patient not taking: Reported on 07/15/2021    [provider]      Allergies    Patient has no known allergies.    Review of Systems   Review of Systems  Gastrointestinal:  Positive for abdominal pain.   Physical Exam Updated Vital Signs BP (!) 153/94   Pulse 88   Temp 98 F (36.7 C) (Oral)   Resp 18   Ht 5\' 10"  (1.778 m)   Wt 97.5 kg   SpO2 98%   BMI 30.85 kg/m  Physical Exam Vitals and nursing note reviewed.  Constitutional:      General: He is not in acute distress.    Appearance: He is not ill-appearing.  HENT:     Head: Atraumatic.  Eyes:      Conjunctiva/sclera: Conjunctivae normal.  Cardiovascular:     Rate and Rhythm: Normal rate and regular rhythm.     Pulses: Normal pulses.     Heart sounds: No murmur heard. Pulmonary:     Effort: Pulmonary effort is normal. No respiratory distress.     Breath sounds: Normal breath sounds.  Abdominal:     General: Abdomen is flat. There is no distension.     Palpations: Abdomen is soft.     Tenderness: There is no abdominal tenderness.     Comments: When patient lies flat and flexes his neck forward, there is a small protuberance just above the bellybutton.  On palpation, it feels like a reducible small hernia.  Abdomen is otherwise soft, nondistended, nontender to palpation.  Musculoskeletal:        General: Normal range of motion.     Cervical back: Normal range of motion.  Skin:    General: Skin is warm and dry.     Capillary Refill: Capillary refill takes less than 2 seconds.  Neurological:     General: No focal deficit present.     Mental Status: He is alert.  Psychiatric:        Mood and Affect: Mood normal.    ED Results / Procedures / Treatments   Labs (all labs ordered are listed, but only abnormal  results are displayed) Labs Reviewed  URINALYSIS, ROUTINE W REFLEX MICROSCOPIC - Abnormal; Notable for the following components:      Result Value   Leukocytes,Ua TRACE (*)    All other components within normal limits  COMPREHENSIVE METABOLIC PANEL  CBC WITH DIFFERENTIAL/PLATELET  LIPASE, BLOOD    EKG None  Radiology No results found.  Procedures Procedures    Medications Ordered in ED Medications - No data to display  ED Course/ Medical Decision Making/ A&P                           Medical Decision Making Amount and/or Complexity of Data Reviewed Labs: ordered.   Social determinants of health:  Social History   Socioeconomic History   Marital status: Single    Spouse name: Not on file   Number of children: Not on file   Years of education: Not on  file   Highest education level: Not on file  Occupational History   Not on file  Tobacco Use   Smoking status: Former    Packs/day: 0.50    Types: Cigarettes    Quit date: 07/10/2020    Years since quitting: 1.0   Smokeless tobacco: Never  Vaping Use   Vaping Use: Never used  Substance and Sexual Activity   Alcohol use: Yes    Comment: socially    Drug use: Yes    Types: Marijuana    Comment: daily   Sexual activity: Not on file  Other Topics Concern   Not on file  Social History Narrative   Not on file   Social Determinants of Health   Financial Resource Strain: Not on file  Food Insecurity: Not on file  Transportation Needs: Not on file  Physical Activity: Not on file  Stress: Not on file  Social Connections: Not on file  Intimate Partner Violence: Not on file     Initial impression:  This patient presents to the ED for concern of abdominal pain, more specifically a palpable "lump" in his abdomen when flexing his muscles, this involves an extensive number of treatment options, and is a complaint that carries with it a high risk of complications and morbidity.   Differentials include SBO, abdominal mass, hernia, pancreatitis.   Comorbidities affecting care:  Hypertension, uncontrolled for 3 months  Additional history obtained: Previous imaging and CT scans from April and February 2022, previous discharge summaries from  Lab Tests  I Ordered, reviewed, and interpreted labs and EKG.  The pertinent results include:  CBC, lipase, CMP and UA all normal   ED Course/Re-evaluation: 36 year old male in no acute distress, nontoxic-appearing presents to the ED for evaluation of a "lump" in his abdomen when he flexes his abdominal muscles.  Vitals without significant abnormality.  On physical exam, his abdomen is soft, nontender and nondistended.  When laying flat and having him flex his neck forward, it does appear that a small hernia protrudes from above his bellybutton.   It is reproducible and nontender.  Discussed with patient that these are rather benign and if it continues to bother him, he can follow-up with the surgeon for repair.  We did discuss red flag symptoms and when he should return to the ER, such as in the case of an incarcerated or strangulated hernia.  I did consider obtaining CT abdomen, however he has had CTs in the past along with ultrasound and there has been no new changes in his complaint, so  I did not think that this was indicated.  I did order abdominal labs including lipase, CBC and CMP which were without acute abnormality.  Negative UA.  Overall, given his very reassuring work-up and physical exam and clinical appearance, I do not think there is an emergent cause of his symptoms and does not require further work-up.  Discussed plan of management with patient who expresses understanding and is amenable.  Disposition:  After consideration of the diagnostic results, physical exam, history and the patients response to treatment feel that the patent would benefit from discharge.   Hernia of abdominal wall: Plan and management as described above. Discharged home in good condition.         Final Clinical Impression(s) / ED Diagnoses Final diagnoses:  Hernia of abdominal wall    Rx / DC Orders ED Discharge Orders     None         Tonye Pearson, PA-C 07/16/21 1514    Blanchie Dessert, MD 07/18/21 1358

## 2021-09-24 IMAGING — CT CT ABD-PELV W/ CM
2 of 4 series · 16 of 46 positions shown, 18 images · IV contrast (Omnipaque or Isovue)
Comparison: 04/08/2020

CLINICAL DATA: Generalized abdominal pain for 1 month

EXAM:
CT ABDOMEN AND PELVIS WITH CONTRAST
TECHNIQUE: Multidetector CT imaging of the abdomen and pelvis was performed
using the standard protocol following bolus administration of
intravenous contrast.
CONTRAST:  100mL OMNIPAQUE IOHEXOL 300 MG/ML  SOLN

[Series 2: axial st · axial · 0.82mm/px · z∈[+813,+1228]mm · 13 of 91 slices shown, 15 images]
[im 4/91  soft-tissue]
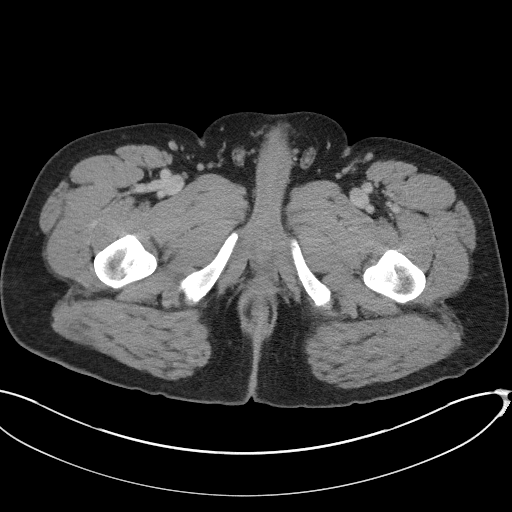
[im 4/91  bone]
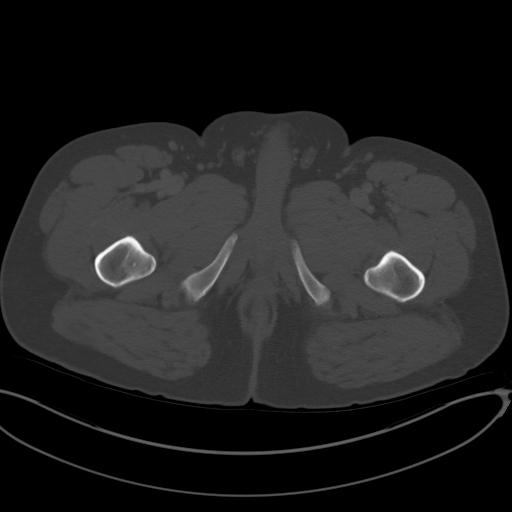
[im 12/91  soft-tissue]
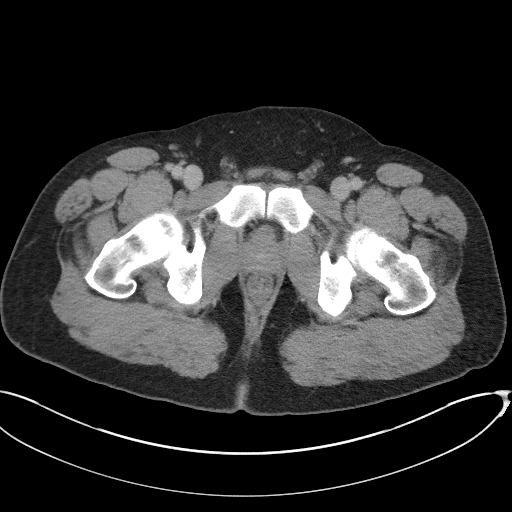
[im 20/91  soft-tissue]
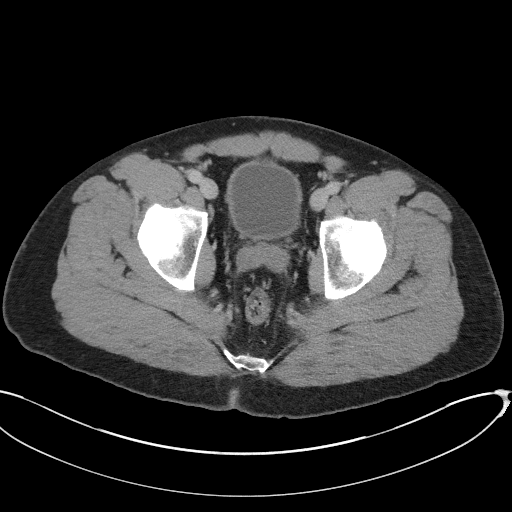
[im 24/91  soft-tissue]
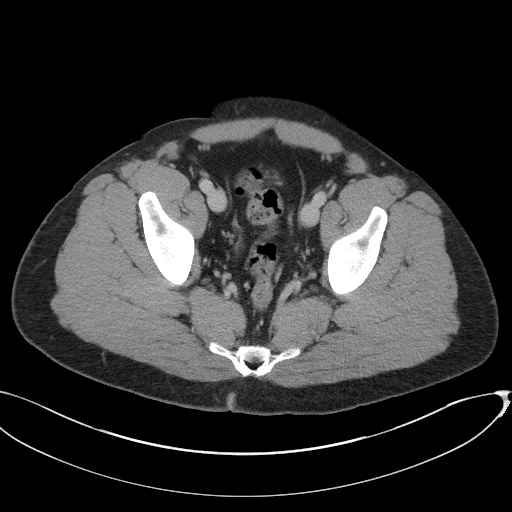
[im 32/91  soft-tissue]
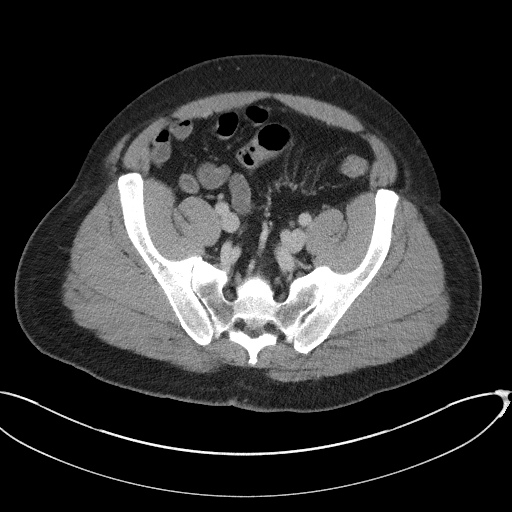
[im 40/91  soft-tissue]
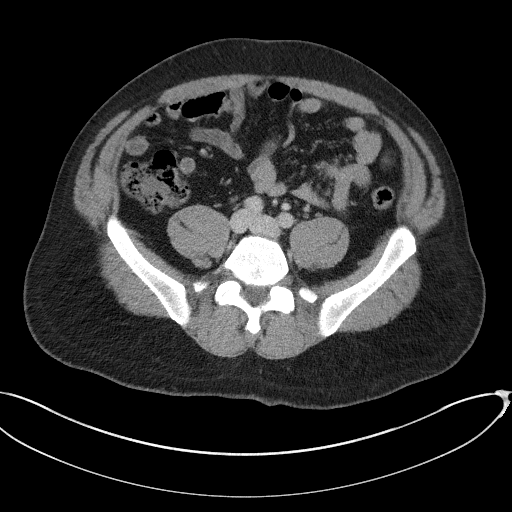
[im 47/91  soft-tissue]
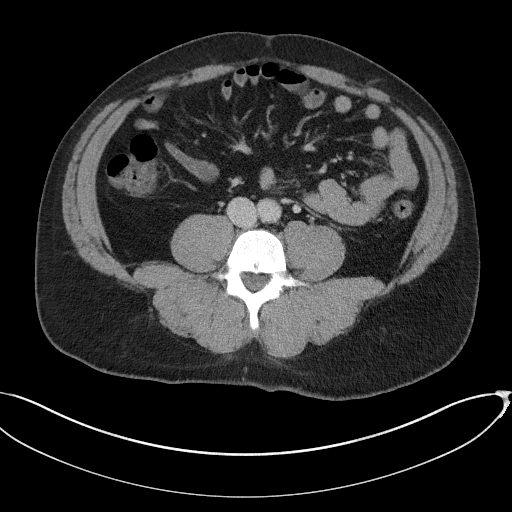
[im 51/91  soft-tissue]
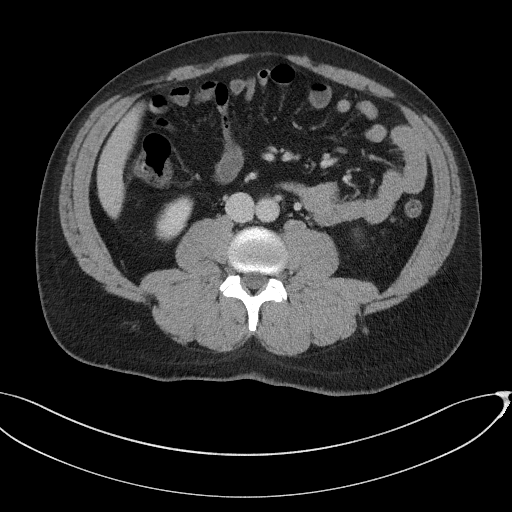
[im 59/91  soft-tissue]
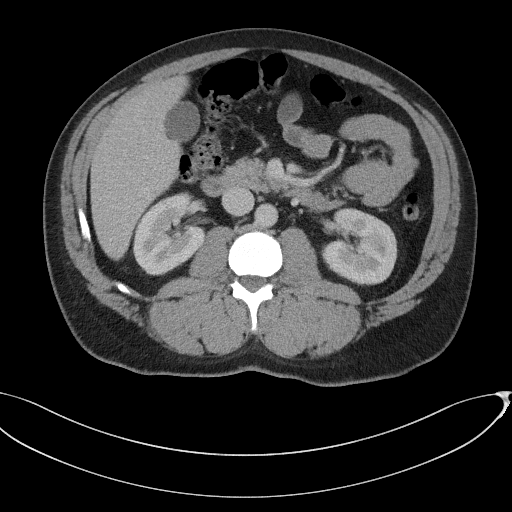
[im 59/91  bone]
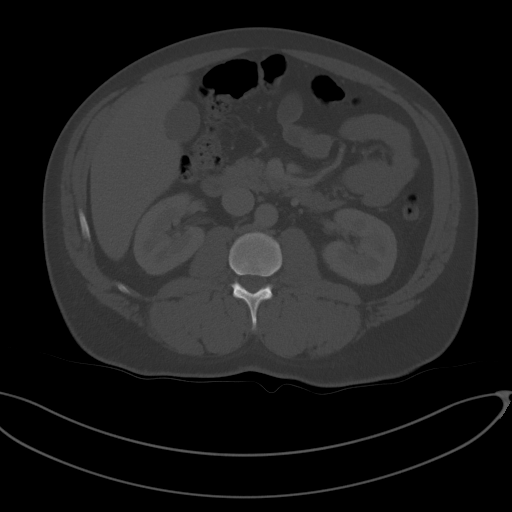
[im 67/91  soft-tissue]
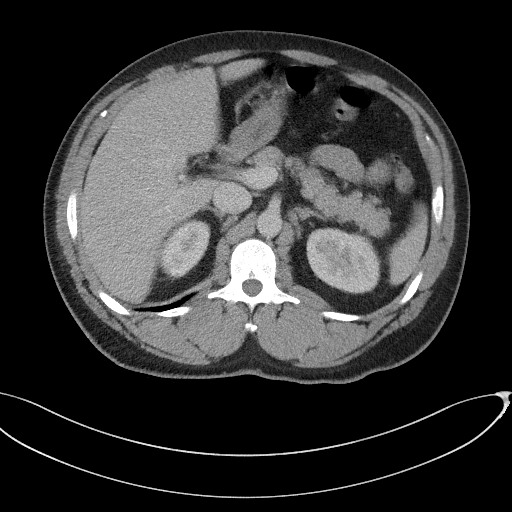
[im 71/91  soft-tissue]
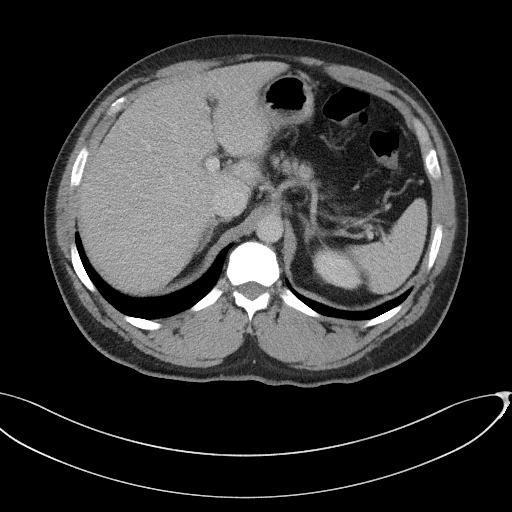
[im 79/91  soft-tissue]
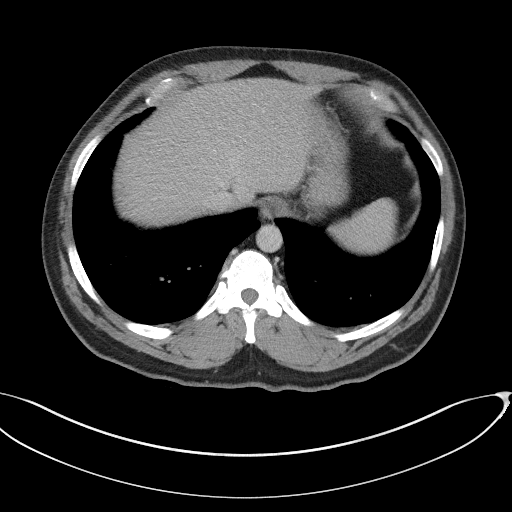
[im 87/91  soft-tissue]
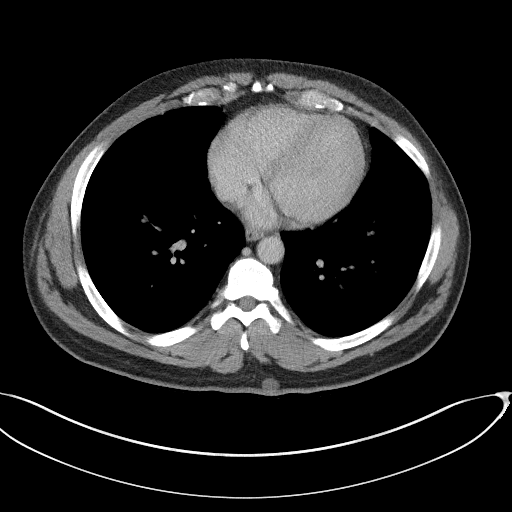

[Series 5: coronal st · coronal · 0.79mm/px · 3 of 116 slices shown]
[im 39/116  soft-tissue]
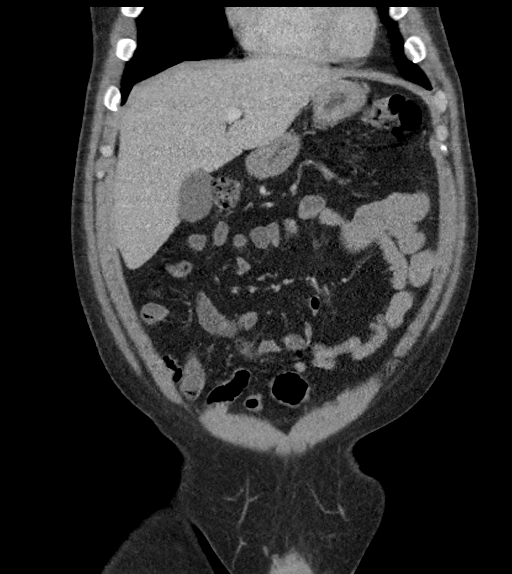
[im 52/116  soft-tissue]
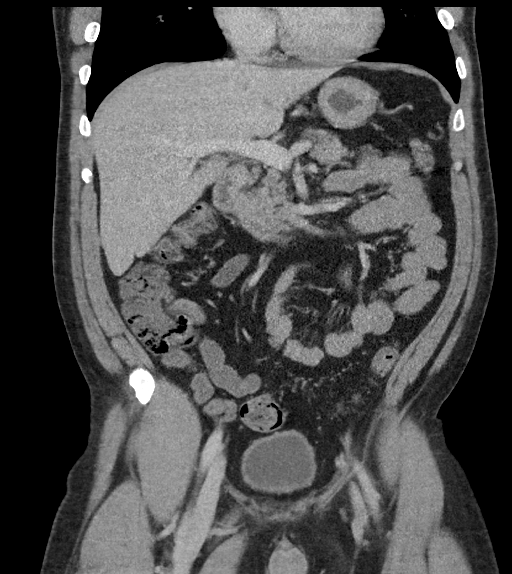
[im 64/116  soft-tissue]
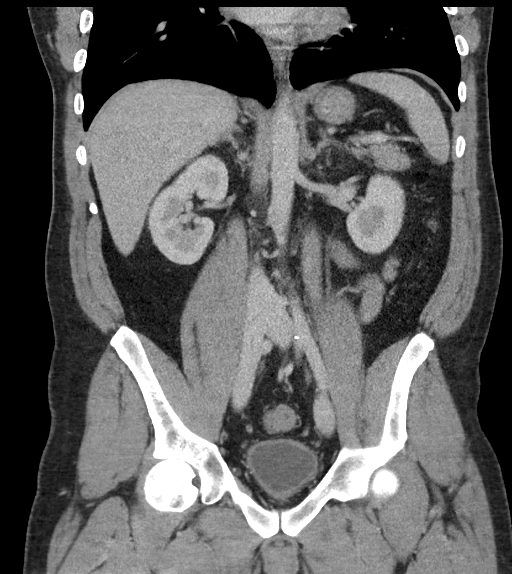

[16 of 46 positions shown; findings below may reference images not displayed]

FINDINGS: Lower chest: Included lung bases are clear.  Heart size is normal.

Hepatobiliary: No focal liver abnormality is seen. No gallstones,
gallbladder wall thickening, or biliary dilatation.

Pancreas: Unremarkable. No pancreatic ductal dilatation or
surrounding inflammatory changes.

Spleen: Normal in size without focal abnormality.

Adrenals/Urinary Tract: Unremarkable adrenal glands. Kidneys enhance
symmetrically without focal lesion, stone, or hydronephrosis.
Ureters are nondilated. Urinary bladder appears unremarkable for the
degree of distension.

Stomach/Bowel: Stomach is within normal limits. Appendix appears
normal (series 2, image 54). No evidence of bowel wall thickening,
distention, or inflammatory changes.

Vascular/Lymphatic: No significant vascular findings are present. No
enlarged abdominal or pelvic lymph nodes.

Reproductive: Prostate is unremarkable.

Other: No free fluid. No abdominopelvic fluid collection. No
pneumoperitoneum. No abdominal wall hernia.

Musculoskeletal: 1.3 cm rounded subcutaneous nodule posterior to the
T9 level, left paramidline. Similar degree of sclerosis associated
with the bilateral SI joints, right greater than left, which may
reflect sequela of sacroiliitis. Osseous structures appear otherwise
unremarkable.
IMPRESSION: 1. No acute abdominopelvic findings.
2. Rounded 1.3 cm subcutaneous nodule posterior to the T9 level,
left paramidline. Findings may represent a sebaceous cyst.
Correlation with physical exam is recommended.
3. Similar degree of sclerosis associated with the bilateral SI
joints, right greater than left, which may reflect sequela of
sacroiliitis.

## 2022-07-27 ENCOUNTER — Emergency Department (HOSPITAL_COMMUNITY): Payer: BC Managed Care – PPO

## 2022-07-27 ENCOUNTER — Other Ambulatory Visit: Payer: Self-pay

## 2022-07-27 ENCOUNTER — Emergency Department (HOSPITAL_COMMUNITY)
Admission: EM | Admit: 2022-07-27 | Discharge: 2022-07-27 | Disposition: A | Discharge status: Home / Self Care | Payer: BC Managed Care – PPO | Attending: Emergency Medicine | Admitting: Emergency Medicine

## 2022-07-27 DIAGNOSIS — Z79899 Other long term (current) drug therapy: Secondary | ICD-10-CM | POA: Insufficient documentation

## 2022-07-27 DIAGNOSIS — R519 Headache, unspecified: Secondary | ICD-10-CM | POA: Diagnosis present

## 2022-07-27 DIAGNOSIS — I10 Essential (primary) hypertension: Secondary | ICD-10-CM | POA: Insufficient documentation

## 2022-07-27 DIAGNOSIS — W01198A Fall on same level from slipping, tripping and stumbling with subsequent striking against other object, initial encounter: Secondary | ICD-10-CM | POA: Insufficient documentation

## 2022-07-27 DIAGNOSIS — G44319 Acute post-traumatic headache, not intractable: Secondary | ICD-10-CM

## 2022-07-27 MED ORDER — ACETAMINOPHEN 325 MG PO TABS
650.0000 mg | ORAL_TABLET | Freq: Once | ORAL | Status: AC
  Administered 2022-07-27: 650 mg via ORAL
  Filled 2022-07-27: qty 2

## 2022-07-27 MED ORDER — IBUPROFEN 400 MG PO TABS
600.0000 mg | ORAL_TABLET | Freq: Once | ORAL | Status: AC
  Administered 2022-07-27: 600 mg via ORAL
  Filled 2022-07-27: qty 1

## 2022-07-27 NOTE — ED Triage Notes (Signed)
Patient reports mechanical fall, causing him to hit his head three weeks ago. Continues to have headache. Denies n/v, vision changes, dizziness, or LOC. No OTC meds tried.

## 2022-07-27 NOTE — ED Provider Notes (Signed)
[place] EMERGENCY DEPARTMENT AT [facility] Provider Note   CSN: [id number] Arrival date & time: 07/27/22  1301     History  Chief Complaint  Patient presents with   Headache    Walter Blackwell is a 37 y.o. male with past medical history seen for hypertension, hyperlipidemia, hyperglycemia who presents with concern for mechanical fall causing him to hit his head about 3 weeks ago.  Patient reports that he has had an intermittent 3-4 out of 10 headache that is centralized to the back of his head.  He denies any numbness, tingling, vision changes, confusion, difficulty with speech, loss of control of bladder, bowels.  He denies any neck pain, difficulty turning his neck.   Headache      Home Medications Prior to Admission medications   Medication Sig Start Date End Date Taking? Authorizing Provider  hydrocortisone (ANUSOL-HC) 2.5 % rectal cream Place 1 application rectally 2 (two) times daily. Patient not taking: Reported on 07/15/2021 10/05/20   Triplett, Babette Relic, PA-C  lisinopril-hydrochlorothiazide (ZESTORETIC) 20-25 MG tablet Take 1 tablet by mouth daily. Patient not taking: Reported on 07/15/2021 12/25/20   Jacquelin Hawking, PA-C  pantoprazole (PROTONIX) 40 MG tablet Take 1 tablet (40 mg total) by mouth daily. Patient not taking: Reported on 07/15/2021 01/27/21   Jacquelin Hawking, PA-C  VITAMIN D PO Take 2,000 Units by mouth daily. Patient not taking: Reported on 07/15/2021    [provider]      Allergies    Patient has no known allergies.    Review of Systems   Review of Systems  Neurological:  Positive for headaches.  All other systems reviewed and are negative.   Physical Exam Updated Vital Signs BP (!) 158/87 (BP Location: Right Arm)   Pulse (!) 105   Temp 98.4 F (36.9 C) (Oral)   Resp 16   SpO2 98%  Physical Exam Vitals and nursing note reviewed.  Constitutional:      General: He is not in acute distress.    Appearance: Normal  appearance.  HENT:     Head: Normocephalic and atraumatic.  Eyes:     General:        Right eye: No discharge.        Left eye: No discharge.  Cardiovascular:     Rate and Rhythm: Normal rate and regular rhythm.  Pulmonary:     Effort: Pulmonary effort is normal. No respiratory distress.  Musculoskeletal:        General: No deformity.  Skin:    General: Skin is warm and dry.     Comments: Patient without any abrasion, or deformity of the back of the head, no bruising at base of skull  Neurological:     Mental Status: He is alert and oriented to person, place, and time.     Comments: Cranial nerves II through XII grossly intact.  Intact finger-nose, intact heel-to-shin.  Romberg negative, gait normal.  Alert and oriented x3.  Moves all 4 limbs spontaneously, normal coordination.  No pronator drift.  Intact strength 5 out of 5 bilateral upper and lower extremities.    Psychiatric:        Mood and Affect: Mood normal.        Behavior: Behavior normal.     ED Results / Procedures / Treatments   Labs (all labs ordered are listed, but only abnormal results are displayed) Labs Reviewed - No data to display  EKG None  Radiology CT Head Wo Contrast  Result Date: 07/27/2022 CLINICAL DATA:  Head trauma, moderate-severe fall EXAM: CT HEAD WITHOUT CONTRAST TECHNIQUE: Contiguous axial images were obtained from the base of the skull through the vertex without intravenous contrast. RADIATION DOSE REDUCTION: This exam was performed according to the departmental dose-optimization program which includes automated exposure control, adjustment of the mA and/or kV according to patient size and/or use of iterative reconstruction technique. COMPARISON:  CT head August 27, 2011. FINDINGS: Brain: No evidence of acute infarction, hemorrhage, hydrocephalus, extra-axial collection or mass lesion/mass effect. Vascular: No hyperdense vessel. Skull: No acute fracture. Sinuses/Orbits: No acute orbital findings.   Clear sinuses. Other: No mastoid effusions. IMPRESSION: No evidence of acute intracranial abnormality. Electronically Signed   By: Feliberto Harts M.D.   On: 07/27/2022 13:40    Procedures Procedures    Medications Ordered in ED Medications  ibuprofen (ADVIL) tablet 600 mg (has no administration in time range)  acetaminophen (TYLENOL) tablet 650 mg (has no administration in time range)    ED Course/ Medical Decision Making/ A&P                             Medical Decision Making Amount and/or Complexity of Data Reviewed Radiology: ordered.   This patient is a 37 y.o. male who presents to the ED for concern of head injury, persistent headaches.   Differential diagnoses prior to evaluation: Skull fracture, intracranial hemorrhage, epidural hematoma, subdural hematoma, subarachnoid hemorrhage, migraines, tension type headache, versus other traumatic causes of headache, this is not an exhaustive differential.  Past Medical History / Social History / Additional history: Chart reviewed. Pertinent results include:, Hyperlipidemia, otherwise noncontributory  Physical Exam: Physical exam performed. The pertinent findings include: No obvious signs of trauma several weeks out from the incident in question, he has no Battle sign, raccoon eyes, hemotympanum.  He is no neurologic deficits on my exam.  Medications / Treatment: Discussed medications with patient including IM Toradol, IV migraine cocktail versus oral medications, especially as he has not tried anything at home I think is reasonable to start with ibuprofen, Tylenol, patient agrees to this plan, administer ibuprofen, Tylenol in the emergency department.  Patient is stable for discharge at this time, he does not want a wait to see if the headache improves, with a headache of only 3/4 out of 10 and no neurologic deficits on my exam I think it is reasonable for him to discharge at this time.   Disposition: After consideration of the  diagnostic results and the patients response to treatment, I feel that patient with tension type headache possibly related to some mild cervical strain from his fall, his physical exam is reassuring,  I independently interpreted CT head without contrast which shows no evidence of acute intracranial abnormality.  I agree with the radiologist interpretation.Marland Kitchen   emergency department workup does not suggest an emergent condition requiring admission or immediate intervention beyond what has been performed at this time. The plan is: as above. The patient is safe for discharge and has been instructed to return immediately for worsening symptoms, change in symptoms or any other concerns.  Final Clinical Impression(s) / ED Diagnoses Final diagnoses:  None    Rx / DC Orders ED Discharge Orders     None         Olene Floss, PA-C 07/27/22 1636    Sloan Leiter, DO 07/28/22 0016

## 2022-07-27 NOTE — Discharge Instructions (Signed)
Please use Tylenol or ibuprofen for pain.  You may use 600 mg ibuprofen every 6 hours or 1000 mg of Tylenol every 6 hours.  You may choose to alternate between the 2.  This would be most effective.  Not to exceed 4 g of Tylenol within 24 hours.  Not to exceed 3200 mg ibuprofen 24 hours.  

## 2023-03-16 ENCOUNTER — Encounter (HOSPITAL_COMMUNITY): Payer: Self-pay | Admitting: Emergency Medicine

## 2023-03-16 ENCOUNTER — Emergency Department (HOSPITAL_COMMUNITY)
Admission: EM | Admit: 2023-03-16 | Discharge: 2023-03-16 | Disposition: A | Discharge status: Home / Self Care | Payer: BC Managed Care – PPO | Attending: Emergency Medicine | Admitting: Emergency Medicine

## 2023-03-16 ENCOUNTER — Other Ambulatory Visit: Payer: Self-pay

## 2023-03-16 DIAGNOSIS — I10 Essential (primary) hypertension: Secondary | ICD-10-CM | POA: Diagnosis not present

## 2023-03-16 DIAGNOSIS — M545 Low back pain, unspecified: Secondary | ICD-10-CM | POA: Diagnosis present

## 2023-03-16 DIAGNOSIS — S39012A Strain of muscle, fascia and tendon of lower back, initial encounter: Secondary | ICD-10-CM | POA: Insufficient documentation

## 2023-03-16 DIAGNOSIS — X500XXA Overexertion from strenuous movement or load, initial encounter: Secondary | ICD-10-CM | POA: Insufficient documentation

## 2023-03-16 DIAGNOSIS — Z79899 Other long term (current) drug therapy: Secondary | ICD-10-CM | POA: Diagnosis not present

## 2023-03-16 DIAGNOSIS — Z87891 Personal history of nicotine dependence: Secondary | ICD-10-CM | POA: Insufficient documentation

## 2023-03-16 MED ORDER — HYDROCODONE-ACETAMINOPHEN 5-325 MG PO TABS: 1.0000 | ORAL_TABLET | Freq: Four times a day (QID) | ORAL | 0 refills | Status: AC | PRN

## 2023-03-16 NOTE — Discharge Instructions (Signed)
 Take the hydrocodone  at night as needed for rest.  In the daytime take extra strength Tylenol  2 tablets every 8 hours.  But cannot take the Tylenol  while taking the hydrocodone .  Take Motrin  800 mg every 8 hours as well.  Rest is much as possible.  Work note provided.  Return for any new or worse symptoms.  Follow-up with your primary care doctor if not improving.

## 2023-03-16 NOTE — ED Triage Notes (Addendum)
 Pt ambulatory to triage . Pt states, "I think I pulled something in my back." Pt reports that he was lifting something from the ground around 50 pounds three days ago when he felt something pull in his back.  Pt endorses pain to his lower back.  Denies numbness and tingling in extremities, denies urinary changes.  Pt took tylenol  yesterday with no relief.

## 2023-03-16 NOTE — ED Provider Notes (Signed)
 [place] EMERGENCY DEPARTMENT AT [facility] Provider Note   CSN: [id number] Arrival date & time: 03/16/23  6213     History  Chief Complaint  Patient presents with   Back Pain    Walter Blackwell is a 38 y.o. male.  Patient with complaint of bilateral low back pain kind of radiates into the thigh area.  No incontinence no weakness or sensory changes to the feet.  Said he was lifting a 50 pound dog bag 3 days ago and he felt something pull in his back.  Is getting a little bit better but is not completely better.  Patient has had injury to the back in the past but it was many years ago.  Past medical history is significant just for high blood pressure no diabetes.  Patient is a former smoker quit in 2022       Home Medications Prior to Admission medications   Medication Sig Start Date End Date Taking? Authorizing Provider  HYDROcodone -acetaminophen  (NORCO/VICODIN) 5-325 MG tablet Take 1 tablet by mouth every 6 (six) hours as needed. 03/16/23  Yes Zakariah Urwin, MD  hydrocortisone  (ANUSOL -HC) 2.5 % rectal cream Place 1 application rectally 2 (two) times daily. Patient not taking: Reported on 07/15/2021 10/05/20   Triplett, Benn Brash, PA-C  lisinopril -hydrochlorothiazide  (ZESTORETIC ) 20-25 MG tablet Take 1 tablet by mouth daily. Patient not taking: Reported on 07/15/2021 12/25/20   Luane Rumps, PA-C  pantoprazole  (PROTONIX ) 40 MG tablet Take 1 tablet (40 mg total) by mouth daily. Patient not taking: Reported on 07/15/2021 01/27/21   Luane Rumps, PA-C  VITAMIN D PO Take 2,000 Units by mouth daily. Patient not taking: Reported on 07/15/2021    [provider]      Allergies    Patient has no known allergies.    Review of Systems   Review of Systems  Constitutional:  Negative for chills and fever.  HENT:  Negative for ear pain and sore throat.   Eyes:  Negative for pain and visual disturbance.  Respiratory:  Negative for cough and shortness of breath.    Cardiovascular:  Negative for chest pain and palpitations.  Gastrointestinal:  Negative for abdominal pain and vomiting.  Genitourinary:  Negative for difficulty urinating, dysuria and hematuria.  Musculoskeletal:  Positive for back pain. Negative for arthralgias.  Skin:  Negative for color change and rash.  Neurological:  Negative for seizures, syncope, weakness and numbness.  All other systems reviewed and are negative.   Physical Exam Updated Vital Signs BP (!) 155/91 (BP Location: Right Arm)   Pulse (!) 107   Temp 98.7 F (37.1 C) (Oral)   Resp 17   Ht 1.778 m (5\' 10" )   Wt 95.3 kg   SpO2 99%   BMI 30.13 kg/m  Physical Exam Vitals and nursing note reviewed.  Constitutional:      General: He is not in acute distress.    Appearance: Normal appearance. He is well-developed.  HENT:     Head: Normocephalic and atraumatic.  Eyes:     Conjunctiva/sclera: Conjunctivae normal.  Cardiovascular:     Rate and Rhythm: Normal rate and regular rhythm.     Heart sounds: No murmur heard. Pulmonary:     Effort: Pulmonary effort is normal. No respiratory distress.     Breath sounds: Normal breath sounds.  Abdominal:     Palpations: Abdomen is soft.     Tenderness: There is no abdominal tenderness.  Musculoskeletal:        General:  No swelling or tenderness.     Cervical back: Neck supple. No rigidity.  Skin:    General: Skin is warm and dry.     Capillary Refill: Capillary refill takes less than 2 seconds.  Neurological:     General: No focal deficit present.     Mental Status: He is alert and oriented to person, place, and time.     Cranial Nerves: No cranial nerve deficit.     Sensory: No sensory deficit.     Motor: No weakness.  Psychiatric:        Mood and Affect: Mood normal.     ED Results / Procedures / Treatments   Labs (all labs ordered are listed, but only abnormal results are displayed) Labs Reviewed - No data to display  EKG None  Radiology No results  found.  Procedures Procedures    Medications Ordered in ED Medications - No data to display  ED Course/ Medical Decision Making/ A&P                                 Medical Decision Making Risk Prescription drug management.   Consistent with lumbar strain.  Patient states pain radiates into the upper thigh area.  But no incontinence no lower extremity symptoms to be suggestive of sciatica.  Not worried about cauda equina syndrome.  Will treat with extra strength Tylenol  Motrin  during daytime and prepack of hydrocodone  available for taking at night.  Work note provided.  Follow-up with primary care doctor as needed.   Final Clinical Impression(s) / ED Diagnoses Final diagnoses:  Strain of lumbar region, initial encounter    Rx / DC Orders ED Discharge Orders          Ordered    HYDROcodone -acetaminophen  (NORCO/VICODIN) 5-325 MG tablet  Every 6 hours PRN        03/16/23 5621              Zaila Crew, MD 03/16/23 [phone]
